# Patient Record
Sex: Male | Born: 1985 | Race: Black or African American | Hispanic: No | Marital: Single | Smoking: Current every day smoker
Health system: Southern US, Community
[De-identification: ages and names within clinical notes are randomized; demographics above are authoritative.]

## PROBLEM LIST (undated history)

## (undated) ENCOUNTER — Encounter

## (undated) ENCOUNTER — Telehealth

## (undated) ENCOUNTER — Other Ambulatory Visit

## (undated) ENCOUNTER — Ambulatory Visit

## (undated) ENCOUNTER — Inpatient Hospital Stay: Payer: Medicaid (Managed Care)

## (undated) ENCOUNTER — Encounter: Attending: Addiction (Substance Use Disorder) | Primary: Addiction (Substance Use Disorder)

## (undated) ENCOUNTER — Ambulatory Visit: Payer: Medicaid (Managed Care)

## (undated) ENCOUNTER — Encounter: Attending: Registered" | Primary: Registered"

## (undated) ENCOUNTER — Ambulatory Visit: Payer: BLUE CROSS/BLUE SHIELD

## (undated) ENCOUNTER — Ambulatory Visit: Attending: Addiction (Substance Use Disorder) | Primary: Addiction (Substance Use Disorder)

## (undated) ENCOUNTER — Telehealth: Attending: Registered" | Primary: Registered"

## (undated) ENCOUNTER — Encounter: Attending: Clinical | Primary: Clinical

## (undated) ENCOUNTER — Ambulatory Visit: Payer: PRIVATE HEALTH INSURANCE

## (undated) DIAGNOSIS — K649 Unspecified hemorrhoids: Secondary | ICD-10-CM

---

## 2010-09-05 ENCOUNTER — Emergency Department (HOSPITAL_COMMUNITY)
Admission: EM | Admit: 2010-09-05 | Discharge: 2010-09-05 | Payer: Self-pay | Source: Home / Self Care | Admitting: Emergency Medicine

## 2010-11-28 ENCOUNTER — Emergency Department (HOSPITAL_COMMUNITY)
Admission: EM | Admit: 2010-11-28 | Discharge: 2010-11-28 | Disposition: A | Discharge status: Home / Self Care | Payer: Self-pay | Attending: Emergency Medicine | Admitting: Emergency Medicine

## 2010-11-28 DIAGNOSIS — R369 Urethral discharge, unspecified: Secondary | ICD-10-CM | POA: Insufficient documentation

## 2010-11-28 DIAGNOSIS — A64 Unspecified sexually transmitted disease: Secondary | ICD-10-CM | POA: Insufficient documentation

## 2010-11-29 LAB — GC/CHLAMYDIA PROBE AMP, GENITAL: Chlamydia, DNA Probe: NEGATIVE

## 2011-08-01 ENCOUNTER — Encounter: Payer: Self-pay | Admitting: Emergency Medicine

## 2011-08-01 ENCOUNTER — Emergency Department (HOSPITAL_COMMUNITY)
Admission: EM | Admit: 2011-08-01 | Discharge: 2011-08-01 | Disposition: A | Discharge status: Home / Self Care | Payer: Self-pay | Attending: Emergency Medicine | Admitting: Emergency Medicine

## 2011-08-01 DIAGNOSIS — A630 Anogenital (venereal) warts: Secondary | ICD-10-CM | POA: Diagnosis present

## 2011-08-01 DIAGNOSIS — K644 Residual hemorrhoidal skin tags: Secondary | ICD-10-CM | POA: Insufficient documentation

## 2011-08-01 DIAGNOSIS — R300 Dysuria: Secondary | ICD-10-CM | POA: Insufficient documentation

## 2011-08-01 DIAGNOSIS — R369 Urethral discharge, unspecified: Secondary | ICD-10-CM | POA: Insufficient documentation

## 2011-08-01 DIAGNOSIS — N342 Other urethritis: Secondary | ICD-10-CM | POA: Insufficient documentation

## 2011-08-01 DIAGNOSIS — K921 Melena: Secondary | ICD-10-CM | POA: Insufficient documentation

## 2011-08-01 DIAGNOSIS — K6289 Other specified diseases of anus and rectum: Secondary | ICD-10-CM | POA: Insufficient documentation

## 2011-08-01 DIAGNOSIS — K625 Hemorrhage of anus and rectum: Secondary | ICD-10-CM | POA: Insufficient documentation

## 2011-08-01 MED ORDER — AZITHROMYCIN 250 MG PO TABS
1000.0000 mg | ORAL_TABLET | Freq: Once | ORAL | Status: AC
  Administered 2011-08-01: 1000 mg via ORAL
  Filled 2011-08-01: qty 4

## 2011-08-01 MED ORDER — TRAMADOL HCL 50 MG PO TABS: 50.0000 mg | ORAL_TABLET | Freq: Four times a day (QID) | ORAL | Status: AC | PRN

## 2011-08-01 MED ORDER — LIDOCAINE HCL (PF) 1 % IJ SOLN
INTRAMUSCULAR | Status: AC
  Administered 2011-08-01: 08:00:00
  Filled 2011-08-01: qty 5

## 2011-08-01 MED ORDER — DOCUSATE SODIUM 100 MG PO CAPS: 100.0000 mg | ORAL_CAPSULE | Freq: Two times a day (BID) | ORAL | Status: AC

## 2011-08-01 MED ORDER — HYDROCORTISONE ACETATE 25 MG RE SUPP: 25.0000 mg | Freq: Two times a day (BID) | RECTAL | Status: AC

## 2011-08-01 MED ORDER — CEFTRIAXONE SODIUM 250 MG IJ SOLR
250.0000 mg | Freq: Once | INTRAMUSCULAR | Status: AC
  Administered 2011-08-01: 250 mg via INTRAMUSCULAR
  Filled 2011-08-01: qty 250

## 2011-08-01 NOTE — ED Provider Notes (Signed)
History     CSN: [id number] Arrival date & time: 08/01/2011  4:11 AM   First MD Initiated Contact with Patient 08/01/11 [phone]      Chief Complaint  Patient presents with  . Rectal Bleeding    (Consider location/radiation/quality/duration/timing/severity/associated sxs/prior treatment) HPI Comments: Pt states he has h/o hemorrhoids however pain and bleeding has been worse than normal in past 24 hours. No h/o bleeding disorder or blood thinner. No h/o fatigue or other symptoms of anemia.  He has had same symptoms in past but bleeding has typically been less in amount.   Patient admits to having unprotected anal intercourse 5 days ago. Now c/o pain with urination and occasional mild discharge. No sores or ulcers.   Patient is a 25 y.o. male presenting with hematochezia and penile discharge. The history is provided by the patient.  Rectal Bleeding  The current episode started yesterday. The problem occurs occasionally. The problem has been unchanged. The pain is moderate. The stool is described as soft and streaked with blood. Prior successful therapies include stool softeners. Associated symptoms include hemorrhoids and rectal pain. Pertinent negatives include no anorexia, no fever, no abdominal pain, no vomiting, no hematuria and no rash.  Penile Discharge This is a new problem. The current episode started yesterday. The problem has been unchanged. Pertinent negatives include no abdominal pain, anorexia, fatigue, fever, rash, sore throat or vomiting. He has tried nothing for the symptoms.    History reviewed. No pertinent past medical history.  History reviewed. No pertinent past surgical history.  No family history on file.  History  Substance Use Topics  . Smoking status: Current Everyday Smoker  . Smokeless tobacco: Not on file  . Alcohol Use: Yes     OCCASIONAL      Review of Systems  Constitutional: Negative for fever and fatigue.  HENT: Negative for nosebleeds and sore  throat.   Eyes: Negative for discharge and redness.  Gastrointestinal: Positive for blood in stool, hematochezia, rectal pain and hemorrhoids. Negative for vomiting, abdominal pain and anorexia.  Genitourinary: Positive for dysuria and discharge. Negative for hematuria, flank pain, genital sores, penile pain and testicular pain.  Musculoskeletal: Negative for back pain.  Skin: Negative for rash.  Neurological: Negative for light-headedness.  Hematological: Negative for adenopathy.    Allergies  Review of patient's allergies indicates no known allergies.  Home Medications   Current Outpatient Rx  Name Route Sig Dispense Refill  . IBUPROFEN 200 MG PO TABS Oral Take 200 mg by mouth every 6 (six) hours as needed. For pain     . NAPROXEN SODIUM 220 MG PO TABS Oral Take 220 mg by mouth 2 (two) times daily as needed. For pain       BP 125/63  Pulse 76  Temp(Src) 98.3 F (36.8 C) (Oral)  SpO2 99%  Physical Exam  Nursing note and vitals reviewed. Constitutional: He is oriented to person, place, and time. He appears well-developed and well-nourished.  HENT:  Head: Normocephalic and atraumatic.  Mouth/Throat: Oropharynx is clear and moist.  Eyes: Conjunctivae are normal. Pupils are equal, round, and reactive to light. Right eye exhibits no discharge. Left eye exhibits no discharge.  Neck: Normal range of motion. Neck supple.  Pulmonary/Chest: Effort normal.  Abdominal: Soft. Bowel sounds are normal. There is no tenderness. There is no rebound and no guarding.  Genitourinary: Prostate normal and penis normal. Rectal exam shows external hemorrhoid.  Musculoskeletal: He exhibits no edema.  Neurological: He is alert and oriented  to person, place, and time.  Skin: Skin is warm and dry.  Psychiatric: He has a normal mood and affect.    ED Course  Procedures (including critical care time)  Labs Reviewed - No data to display No results found.   No diagnosis found.  8:02 AM Pt seen  and examined.  Will test and treat for STD exposure.  Patient counseled on safe sexual practices.  Told them that they should not have sexual contact for next 7 days and that they need to inform sexual partners so that they can get tested and treated as well.  Urged f/u [facility] for HIV and syphillis testing.  Patient verbalizes understanding and agrees with plan.   Pt counseled on diet and measures to reduce rectal pain and bleeding. Urged return or f/u with worsening symptoms or if bleeding persists > 3 days.    MDM  Possible STD exposure, treated. No concern for syphilis however pt should be tested. Ext hemorrhoid on exam, no fissue but could have one inside rectum given pain on exam. No stool in rectal vault to test. Doubt GI bleed given history of hemorrhoids and mild bleeding.        Carolee Rota, [place] 08/01/11 [phone]

## 2011-08-01 NOTE — ED Notes (Signed)
PT. REPORTS RECTAL BLEEDING  ONSET LAST WEEK , WORSE THIS EVENING WITH PAIN , DENIES INJURY ,  ALSO REPORTS DYSURIA / PENILE PAIN AND DISCHARGE .

## 2011-08-01 NOTE — ED Notes (Signed)
Patient c/o rectal pain and bleeding onset several days ago, states he knows he has hemorroids, and he normally has a small amt of bleeding however last pm when he went to have a bowel movement it was nothing but bright red blood. Also c/o penile discharge.

## 2011-08-02 NOTE — ED Provider Notes (Signed)
Medical screening examination/treatment/procedure(s) were performed by non-physician practitioner and as supervising physician I was immediately available for consultation/collaboration.  Raeford Razor, MD 08/02/11 [phone]

## 2012-02-16 ENCOUNTER — Emergency Department (HOSPITAL_COMMUNITY): Payer: Self-pay

## 2012-02-16 ENCOUNTER — Emergency Department (HOSPITAL_COMMUNITY)
Admission: EM | Admit: 2012-02-16 | Discharge: 2012-02-16 | Disposition: A | Discharge status: Home / Self Care | Payer: Self-pay | Attending: Emergency Medicine | Admitting: Emergency Medicine

## 2012-02-16 ENCOUNTER — Encounter (HOSPITAL_COMMUNITY): Payer: Self-pay

## 2012-02-16 DIAGNOSIS — K6289 Other specified diseases of anus and rectum: Secondary | ICD-10-CM | POA: Insufficient documentation

## 2012-02-16 DIAGNOSIS — F17200 Nicotine dependence, unspecified, uncomplicated: Secondary | ICD-10-CM | POA: Insufficient documentation

## 2012-02-16 DIAGNOSIS — R109 Unspecified abdominal pain: Secondary | ICD-10-CM | POA: Insufficient documentation

## 2012-02-16 DIAGNOSIS — K625 Hemorrhage of anus and rectum: Secondary | ICD-10-CM | POA: Insufficient documentation

## 2012-02-16 DIAGNOSIS — R112 Nausea with vomiting, unspecified: Secondary | ICD-10-CM | POA: Insufficient documentation

## 2012-02-16 DIAGNOSIS — K644 Residual hemorrhoidal skin tags: Secondary | ICD-10-CM | POA: Insufficient documentation

## 2012-02-16 HISTORY — DX: Unspecified hemorrhoids: K64.9

## 2012-02-16 LAB — URINE MICROSCOPIC-ADD ON

## 2012-02-16 LAB — CBC
HCT: 39.4 % (ref 39.0–52.0)
Hemoglobin: 13.3 g/dL (ref 13.0–17.0)
MCH: 29.9 pg (ref 26.0–34.0)
MCHC: 33.8 g/dL (ref 30.0–36.0)
Platelets: 153 10*3/uL (ref 150–400)
RBC: 4.45 MIL/uL (ref 4.22–5.81)

## 2012-02-16 LAB — DIFFERENTIAL
Lymphocytes Relative: 29 % (ref 12–46)
Neutro Abs: 3.7 10*3/uL (ref 1.7–7.7)
Neutrophils Relative %: 63 % (ref 43–77)

## 2012-02-16 LAB — BASIC METABOLIC PANEL
BUN: 9 mg/dL (ref 6–23)
Calcium: 9.8 mg/dL (ref 8.4–10.5)
Chloride: 102 mEq/L (ref 96–112)
Creatinine, Ser: 0.86 mg/dL (ref 0.50–1.35)
GFR calc Af Amer: >90 mL/min (ref >90)
GFR calc non Af Amer: >90 mL/min (ref >90)
Glucose, Bld: 96 mg/dL (ref 70–99)
Potassium: 3.6 mEq/L (ref 3.5–5.1)

## 2012-02-16 LAB — URINALYSIS, ROUTINE W REFLEX MICROSCOPIC: Hgb urine dipstick: NEGATIVE

## 2012-02-16 MED ORDER — IOHEXOL 300 MG/ML  SOLN
100.0000 mL | Freq: Once | INTRAMUSCULAR | Status: AC | PRN
  Administered 2012-02-16: 100 mL via INTRAVENOUS

## 2012-02-16 MED ORDER — HYDROCODONE-ACETAMINOPHEN 5-325 MG PO TABS: ORAL_TABLET | ORAL | Status: AC

## 2012-02-16 MED ORDER — ONDANSETRON HCL 4 MG/2ML IJ SOLN
4.0000 mg | Freq: Once | INTRAMUSCULAR | Status: AC
  Administered 2012-02-16: 4 mg via INTRAVENOUS
  Filled 2012-02-16: qty 2

## 2012-02-16 MED ORDER — HYDROMORPHONE HCL PF 1 MG/ML IJ SOLN
1.0000 mg | Freq: Once | INTRAMUSCULAR | Status: AC
  Administered 2012-02-16: 1 mg via INTRAVENOUS
  Filled 2012-02-16: qty 1

## 2012-02-16 MED ORDER — POLYETHYLENE GLYCOL 3350 17 GM/SCOOP PO POWD: 17.0000 g | Freq: Every day | ORAL | Status: AC

## 2012-02-16 MED ORDER — HYDROCORTISONE ACETATE 25 MG RE SUPP: 25.0000 mg | Freq: Two times a day (BID) | RECTAL | Status: AC

## 2012-02-16 NOTE — Discharge Instructions (Signed)
Please read and follow all provided instructions.  Your diagnoses today include:  1. Rectal pain     Tests performed today include:  Blood tests that are normal  CT of your abdomen that did not show any infections around your rectum  Vital signs. See below for your results today.   Medications prescribed:   Vicodin (hydrocodone/acetaminophen) - narcotic pain medication  You have been prescribed narcotic pain medication such as Vicodin or Percocet: DO NOT drive or perform any activities that require you to be awake and alert because this medicine can make you drowsy. BE VERY CAREFUL not to take multiple medicines containing Tylenol (also called acetaminophen). Doing so can lead to an overdose which can damage your liver and cause liver failure and possibly death.    Miralax - laxative   Anusol suppository - anti-inflammatory  Take any prescribed medications only as directed.  Home care instructions:  Follow any educational materials contained in this packet.  BE VERY CAREFUL not to take multiple medicines containing Tylenol (also called acetaminophen). Doing so can lead to an overdose which can damage your liver and cause liver failure and possibly death.   Continue high fiber diet. Continue soaks several times a day in bathtub with warm water.   Use pain medication only when needed. This medication can caused you to be more constipated.   Follow-up instructions: Follow-up with the surgeon listed for further evaluation.   Please follow-up with your primary care provider in the next 3 days for further evaluation of your symptoms. If you do not have a primary care doctor -- see below for referral information.   Return instructions:   Please return to the Emergency Department if you experience worsening symptoms.   Return with worsening abdominal pain, fever  Please return if you have any other emergent concerns.  Additional Information:  Your vital signs today  were: BP 140/81  Pulse 92  Temp(Src) 97.7 F (36.5 C) (Oral)  Resp 21  SpO2 100% If your blood pressure (BP) was elevated above 135/85 this visit, please have this repeated by your doctor within one month. -------------- No Primary Care Doctor Call [facility]  [phone] Other agencies that provide inexpensive medical care    [facility]  [phone]    [facility]  [phone]    [facility]  [phone]    [facility]  [phone]    [facility]  [phone]    [facility]  [phone] -------------- RESOURCE GUIDE:  Dental Problems  Patients with Medicaid: [facility] [phone] [address]                                            [phone] [address] Phone:  [phone]                                                   Phone:  [phone]  If unable to pay or uninsured, contact:  [facility] or [facility]. to become qualified for the adult dental clinic.  Chronic Pain Problems Contact [facility]  [facility]  [phone] Patients need to be referred by their primary care doctor.  Insufficient Money for Medicine Contact [employer]:  call "211" or [facility] 6510638755.  Psychological Services [facility]  [phone] [facility]  [phone] [facility]   [phone] (emergency services [phone])  Substance Abuse Resources [facility]  [phone] [facility] [phone] [facility] [phone] Floydene Flock [phone] Residential & Outpatient Substance Abuse Program  [phone]  Abuse/Neglect [facility] Child Abuse Hotline 502-103-6637 [facility] Child Abuse Hotline [phone] (After Hours)  Emergency Shelter [facility] [phone]  M[facility] Homes [facility] [place] [facility] [phone] [facility] [phone]  [facility] Resources  [facility]     [employer]                          [facility] Dept. [address] [place]                       [address]      [address]  Blondell Reveal Phone:  [phone]                                   Phone:  [phone]                 Phone:  [phone]  [facility] Mental [facility] Phone:  [phone]  [facility] Child Abuse Hotline (713)[phone] (After Hours)

## 2012-02-16 NOTE — ED Notes (Signed)
Patient reports that he has a history of hemorrhoids and rectal bleeding started 10 days ago. Patient states pain and amount of bleeding has gotten progressively worse. Patient states blood was on the stool and in the stool and bright red in color. Patient denies having clots. Patient states he has been taking preparation H, liquor, hot baths, Percocet and anything he can get his hands on.

## 2012-02-16 NOTE — ED Provider Notes (Signed)
Medical screening examination/treatment/procedure(s) were performed by non-physician practitioner and as supervising physician I was immediately available for consultation/collaboration.   Celene Kras, MD 02/16/12 [phone]

## 2012-02-16 NOTE — ED Provider Notes (Signed)
History     CSN: [id number]  Arrival date & time 02/16/12  1052   First MD Initiated Contact with Patient 02/16/12 1104      Chief Complaint  Patient presents with  . Rectal Bleeding    (Consider location/radiation/quality/duration/timing/severity/associated sxs/prior treatment) HPI Comments: Patient presents with complaint of worsening rectal pain over the past one to 2 weeks. Patient has had this in the past and states he's had external hemorrhoids. Patient reports doing sitz baths, eating fiber, taking a friend's Percocet, using preparation H but this has not helped. He has had several small or bowel movements with much pain. Patient has noted bright red blood in his bowel movements. Patient states he is having trouble with urination, not noted blood. He states it hurts to pass gas. Patient has history of receptive anal sex. He denies other chronic medical problems. He denies draining from his anus or penis. Patient states she has had episodes of nausea and vomiting but is unsure of fever.   Patient is a 26 y.o. male presenting with hematochezia. The history is provided by the patient.  Rectal Bleeding  The current episode started 5 to 7 days ago. The onset was gradual. The problem has been unchanged. The pain is severe. The stool is described as hard. Associated symptoms include abdominal pain, hemorrhoids, nausea, rectal pain and vomiting. Pertinent negatives include no fever, no diarrhea, no chest pain, no headaches, no coughing and no rash.    Past Medical History  Diagnosis Date  . Hemorrhoids     History reviewed. No pertinent past surgical history.  No family history on file.  History  Substance Use Topics  . Smoking status: Current Everyday Smoker  . Smokeless tobacco: Never Used  . Alcohol Use: Yes     OCCASIONAL      Review of Systems  Constitutional: Negative for fever.  HENT: Negative for sore throat and rhinorrhea.   Eyes: Negative for redness.    Respiratory: Negative for cough.   Cardiovascular: Negative for chest pain.  Gastrointestinal: Positive for nausea, vomiting, abdominal pain, constipation, blood in stool, hematochezia, rectal pain and hemorrhoids. Negative for diarrhea and abdominal distention.  Genitourinary: Negative for dysuria, discharge, scrotal swelling, penile pain and testicular pain.  Musculoskeletal: Negative for myalgias.  Skin: Negative for rash.  Neurological: Negative for headaches.    Allergies  Review of patient's allergies indicates no known allergies.  Home Medications   Current Outpatient Rx  Name Route Sig Dispense Refill  . ASPIRIN 325 MG PO TABS Oral Take 650 mg by mouth daily as needed. For pain.    Marland Kitchen DIPHENHYDRAMINE HCL 25 MG PO TABS Oral Take 25 mg by mouth every 6 (six) hours as needed. For sleep.    Marland Kitchen HYDROCODONE-ACETAMINOPHEN 5-500 MG PO TABS Oral Take 1 tablet by mouth every 6 (six) hours as needed. For pain.    Marland Kitchen NAPROXEN SODIUM 220 MG PO TABS Oral Take 440 mg by mouth 2 (two) times daily as needed. For pain      BP 140/81  Pulse 92  Temp(Src) 97.7 F (36.5 C) (Oral)  Resp 21  SpO2 100%  Physical Exam  Nursing note and vitals reviewed. Constitutional: He appears well-developed and well-nourished.  HENT:  Head: Normocephalic and atraumatic.  Eyes: Conjunctivae are normal. Right eye exhibits no discharge. Left eye exhibits no discharge.  Neck: Normal range of motion. Neck supple.  Cardiovascular: Normal rate, regular rhythm and normal heart sounds.   Pulmonary/Chest: Effort normal and breath  sounds normal.  Abdominal: Soft. There is no tenderness.  Genitourinary: Penis normal. Rectal exam shows external hemorrhoid (several very small, non-thrombosed external hemorrhoids) and tenderness. Rectal exam shows no internal hemorrhoid and no fissure. No penile tenderness.       Only able to insert fingertip into anus before patient has severe pain and cannot tolerate. Patient's perineum  is firm and tender as well. Skin appears normal.   Neurological: He is alert.  Skin: Skin is warm and dry.  Psychiatric: He has a normal mood and affect.    ED Course  Procedures (including critical care time)  Labs Reviewed  URINALYSIS, ROUTINE W REFLEX MICROSCOPIC - Abnormal; Notable for the following:    Leukocytes, UA SMALL (*)    All other components within normal limits  URINE MICROSCOPIC-ADD ON - Abnormal; Notable for the following:    Squamous Epithelial / LPF FEW (*)    Bacteria, UA FEW (*)    All other components within normal limits  CBC  DIFFERENTIAL  BASIC METABOLIC PANEL   Ct Pelvis W Contrast  02/16/2012  *RADIOLOGY REPORT*  Clinical Data:  Rectal pain and bleeding  CT PELVIS WITH CONTRAST  Technique:  Multidetector CT imaging of the pelvis was performed using the standard protocol following bolus administration of intravenous contrast.  Contrast: OMNIPAQUE IOHEXOL 300 MG/ML  SOLN  Comparison:   None.  Findings:  No evidence of rectal mass or perirectal abscess. Unremarkable bladder and prostate.  No free fluid.  No disproportionate dilatation of bowel.  IMPRESSION: No acute intrapelvic pathology.  Original Report Authenticated By: Donavan Burnet, M.D.     1. Rectal pain     11:29 AM Patient seen and examined. Work-up initiated. Medications ordered. CT ordered to r/o rectal abscess given pain out of proportion and risk factors.   Vital signs reviewed and are as follows: Filed Vitals:   02/16/12 1104  BP: 140/81  Pulse: 92  Temp: 97.7 F (36.5 C)  Resp: 21   4:50 PM Labs reassuring. CT is negative for cellulitis or abscess. Patient's pain is improved. Will treat for constipation and hemorrhoids. Will have patient follow-up with surgery for further eval.   Patient counseled on use of narcotic pain medications. Counseled not to combine these medications with others containing tylenol. Urged not to drink alcohol, drive, or perform any other activities that  requires focus while taking these medications. Urged to only use for severe pain because this medicine will make him more constipated. The patient verbalizes understanding and agrees with the plan.  MDM  Rectal pain -- given amount of tenderness and inability to fully examine patient, CT was performed and is negative. He will need surgery eval. No acute or emergent conditions identified. Patient will be given symptomatic control until surgery eval.          Renne Crigler, PA 02/16/12 1652

## 2012-02-23 ENCOUNTER — Ambulatory Visit (INDEPENDENT_AMBULATORY_CARE_PROVIDER_SITE_OTHER): Payer: Self-pay | Admitting: Surgery

## 2012-03-18 ENCOUNTER — Ambulatory Visit (INDEPENDENT_AMBULATORY_CARE_PROVIDER_SITE_OTHER): Payer: Self-pay | Admitting: Surgery

## 2012-03-26 ENCOUNTER — Encounter (INDEPENDENT_AMBULATORY_CARE_PROVIDER_SITE_OTHER): Payer: Self-pay | Admitting: Surgery

## 2012-05-07 ENCOUNTER — Encounter (INDEPENDENT_AMBULATORY_CARE_PROVIDER_SITE_OTHER): Payer: Self-pay | Admitting: Surgery

## 2012-05-07 ENCOUNTER — Ambulatory Visit (INDEPENDENT_AMBULATORY_CARE_PROVIDER_SITE_OTHER): Payer: Self-pay | Admitting: Surgery

## 2012-05-07 ENCOUNTER — Telehealth (INDEPENDENT_AMBULATORY_CARE_PROVIDER_SITE_OTHER): Payer: Self-pay

## 2012-05-07 VITALS — BP 142/80 | HR 68 | Temp 97.1°F | Resp 12 | Ht 69.5 in | Wt 144.6 lb

## 2012-05-07 DIAGNOSIS — A630 Anogenital (venereal) warts: Secondary | ICD-10-CM

## 2012-05-07 MED ORDER — PRAMOXINE HCL 1 % RE FOAM: RECTAL | Status: AC | PRN

## 2012-05-07 NOTE — Telephone Encounter (Signed)
Patient extremely anxious.  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  His Aunt, however, keeps trying to get [place] to tell her what's going on.  We noted this would be a HIPPA violation and the only person that has the right to that information as the patient since he has expressly forbidden [place] to discuss it with her or anyone else.  That is our legal obligation to follow the patient's wishes on this.  Would recommend the patient talk to his family.  We noted our plan and recommendations.

## 2012-05-07 NOTE — Telephone Encounter (Signed)
Pt's aunt came with pt today for his appt with Dr Michaell Cowing. The aunt asked to speak to me in the lobby with the pt and his family after he had seen Dr Michaell Cowing today. The pt during his visit told me that he did not want his family knowing his diagnosis b/c he thought his aunt would get really mad. The front desk asked for me to go speak to them with the pt and when I went out there the aunt was requesting the after visit summary sheet from today's visit. I explained to her that she would have to get that info from the pt b/c HIPPA and she was wanting [place] to prescribe some medicine for the pt. The aunt kept trying to explain to me that we needed to order some medicine to help prevent the spasms with hemorrhoids and the discomfort. I advised her that we would not prescribe any pain medicines but she advised me that this was not pain medicine. I advised her that Dr Michaell Cowing was only going to prescribe some Anal pram for the pt nothing else but they informed me the pt has no money. I then advised her that anal pram without insurance is expensive and the pt needed to stick with otc preparation H per Dr Michaell Cowing. I then advised the aunt that the pt just really needs to have the surgery to fix the problem but once again she told me the pt has no money so it will be a while before that gets scheduled. I told her she needed to talk to the pt get all of the info from today's visit. I did notify Dr Michaell Cowing of the conversation.

## 2012-05-07 NOTE — Patient Instructions (Addendum)
See the Handout(s) we gave you.  Consider surgery.  Please call our office at [phone] if you wish to schedule surgery or if you have further questions / concerns.   Genital Warts Genital warts are a sexually transmitted infection. They may appear as small bumps on the tissues of the genital area. CAUSES  Genital warts are caused by a virus called human papillomavirus (HPV). HPV is the most common sexually transmitted disease (STD) and infection of the sex organs. This infection is spread by having unprotected sex with an infected person. It can be spread by vaginal, anal, and oral sex. Many people do not know they are infected. They may be infected for years without problems. However, even if they do not have problems, they can unknowingly pass the infection to their sexual partners. SYMPTOMS   Itching and irritation in the genital area.   Warts that bleed.   Painful sexual intercourse.  DIAGNOSIS  Warts are usually recognized with the naked eye on the vagina, vulva, perineum, anus, and rectum. Certain tests can also diagnose genital warts, such as:  A Pap test.   A tissue sample (biopsy) exam.   Colposcopy. A magnifying tool is used to examine the vagina and cervix. The HPV cells will change color when certain solutions are used.  TREATMENT  Warts can be removed by:  Applying certain chemicals, such as cantharidin or podophyllin.   Liquid nitrogen freezing (cryotherapy).   Immunotherapy with candida or trichophyton injections.   Laser treatment.   Burning with an electrified probe (electrocautery).   Interferon injections.   Surgery.  PREVENTION  HPV vaccination can help prevent HPV infections that cause genital warts and that cause cancer of the cervix. It is recommended that the vaccination be given to people between the ages 9 to 26 years old. The vaccine might not work as well or might not work at all if you already have HPV. It should not be given to pregnant  women. HOME CARE INSTRUCTIONS   It is important to follow your caregiver's instructions. The warts will not go away without treatment. Repeat treatments are often needed to get rid of warts. Even after it appears that the warts are gone, the normal tissue underneath often remains infected.   Do not try to treat genital warts with medicine used to treat hand warts. This type of medicine is strong and can burn the skin in the genital area, causing more damage.   Tell your past and current sexual partner(s) that you have genital warts. They may be infected also and need treatment.   Avoid sexual contact while being treated.   Do not touch or scratch the warts. The infection may spread to other parts of your body.   Women with genital warts should have a cervical cancer check (Pap test) at least once a year. This type of cancer is slow-growing and can be cured if found early. Chances of developing cervical cancer are increased with HPV.   Inform your obstetrician about your warts in the event of pregnancy. This virus can be passed to the baby's respiratory tract. Discuss this with your caregiver.   Use a condom during sexual intercourse. Following treatment, the use of condoms will help prevent reinfection.   Ask your caregiver about using over-the-counter anti-itch creams.  SEEK MEDICAL CARE IF:   Your treated skin becomes red, swollen, or painful.   You have a fever.   You feel generally ill.   You feel little lumps in   and around your genital area.   You are bleeding or have painful sexual intercourse.  MAKE SURE YOU:   Understand these instructions.   Will watch your condition.   Will get help right away if you are not doing well or get worse.  Document Released: 09/01/2000 Document Revised: 08/24/2011 Document Reviewed: 03/13/2011 [employer] Patient Information 2012 [employer]. 

## 2012-05-07 NOTE — Addendum Note (Signed)
Addended by: Ardeth Sportsman on: 05/07/2012 04:16 PM   Modules accepted: Orders

## 2012-05-07 NOTE — Progress Notes (Signed)
Subjective:     Patient ID: Juan Mercado, male   DOB: 1986/03/11, 26 y.o.   MRN: [id number]  HPI  Juan Mercado  [birth year] [id number]  Patient Care Team: Provider Not In System as PCP - General  This patient is a 25 y.o.male who presents today for surgical evaluation at the request of Felicita Gage PA-C & [facility].   Reason for evaluation: Persistent painful hemorrhoids  Pleasant young male who has struggled with hemorrhoids he says since 2005.  Swelling and bleeding.  Usually mild.  He noticed worsening pain in November 2012.  He went to the emergency room.  There was concern of possible thrombosed hemorrhoid.  It has gotten worse.  He returned emergency room in late May.  Again hemorrhoids.  He was sent to me for surgical vibration.  Patient has had unprotected anal sex.  Struggles with constipation and usually has small bowel movements About every other day.  Difficult to have a good bowel movement.  He describes the pain as rather sharp and intense.  Occasionally burning.  Worse with bowel movements.  He notes flecks of blood in the toilet and when he wipes.  Tried Preparation H but gets burning and irritation with it.  He is hoping something can be done.  No prior history of any anorectal treatments or colonoscopy.  He's been tested recently and is HIV negative.  Patient Active Problem List  Diagnosis  . Hemorrhoids    Past Medical History  Diagnosis Date  . Hemorrhoids     History reviewed. No pertinent past surgical history.  History   Social History  . Marital Status: Single    Spouse Name: N/A    Number of Children: N/A  . Years of Education: N/A   Occupational History  . Not on file.   Social History Main Topics  . Smoking status: Current Everyday Smoker -- 0.5 packs/day    Types: Cigarettes  . Smokeless tobacco: Never Used  . Alcohol Use: Yes     OCCASIONAL  . Drug Use: No  . Sexually Active: No   Other Topics Concern  . Not on file    Social History Narrative  . No narrative on file    History reviewed. No pertinent family history.  Current Outpatient Prescriptions  Medication Sig Dispense Refill  . aspirin 325 MG tablet Take 650 mg by mouth daily as needed. For pain.      . diphenhydrAMINE (BENADRYL) 25 MG tablet Take 25 mg by mouth every 6 (six) hours as needed. For sleep.      Marland Kitchen HYDROcodone-acetaminophen (VICODIN) 5-500 MG per tablet Take 1 tablet by mouth every 6 (six) hours as needed. For pain.      . naproxen sodium (ANAPROX) 220 MG tablet Take 440 mg by mouth 2 (two) times daily as needed. For pain         No Known Allergies  BP 142/80  Pulse 68  Temp 97.1 F (36.2 C) (Temporal)  Resp 12  Ht 5' 9.5" (1.765 m)  Wt 144 lb 9.6 oz (65.59 kg)  BMI 21.05 kg/m2  No results found.   Review of Systems  Constitutional: Negative for fever, chills and diaphoresis.  HENT: Negative for nosebleeds, sore throat, facial swelling, mouth sores, trouble swallowing and ear discharge.   Eyes: Negative for photophobia, discharge and visual disturbance.  Respiratory: Negative for choking, chest tightness, shortness of breath and stridor.   Cardiovascular: Negative for chest pain and palpitations.  Gastrointestinal:  Positive for constipation, anal bleeding and rectal pain. Negative for nausea, vomiting, abdominal pain, diarrhea, blood in stool and abdominal distention.  Genitourinary: Negative for dysuria, urgency, difficulty urinating and testicular pain.  Musculoskeletal: Negative for myalgias, back pain, arthralgias and gait problem.  Skin: Negative for color change, pallor, rash and wound.  Neurological: Negative for dizziness, speech difficulty, weakness, numbness and headaches.  Hematological: Negative for adenopathy. Does not bruise/bleed easily.  Psychiatric/Behavioral: Negative for hallucinations, confusion and agitation.       Objective:   Physical Exam  Constitutional: He is oriented to person, place,  and time. He appears well-developed and well-nourished. No distress.  HENT:  Head: Normocephalic.  Mouth/Throat: Oropharynx is clear and moist. No oropharyngeal exudate.  Eyes: Conjunctivae and EOM are normal. Pupils are equal, round, and reactive to light. No scleral icterus.  Neck: Normal range of motion. Neck supple. No tracheal deviation present.  Cardiovascular: Normal rate, regular rhythm and intact distal pulses.   Pulmonary/Chest: Effort normal and breath sounds normal. No respiratory distress.  Abdominal: Soft. He exhibits no distension and no mass. There is no tenderness. There is no guarding. Hernia confirmed negative in the right inguinal area and confirmed negative in the left inguinal area.  Genitourinary:       Exam done with assistance of male Medical Assistant in the room.  Perianal skin clean with good hygiene.  No pruritis.   No pilonidal disease.  No abscess/fistula.   High sphincter tone.    Numerous 3-51mm condylomata perianal 2cm out & into anal canal.  Very tender.  No definite posterior midline anal fissure.  Too sensitive to tolerate DRE  Musculoskeletal: Normal range of motion. He exhibits no tenderness.  Lymphadenopathy:    He has no cervical adenopathy.       Right: No inguinal adenopathy present.       Left: No inguinal adenopathy present.  Neurological: He is alert and oriented to person, place, and time. No cranial nerve deficit. He exhibits normal muscle tone. Coordination normal.  Skin: Skin is warm and dry. No rash noted. He is not diaphoretic. No erythema. No pallor.  Psychiatric: He has a normal mood and affect. His behavior is normal. Judgment and thought content normal.       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  No definite proof of fissure or hemorrhoids    Plan:     He is quite miserable with these.  I recommended examination under anesthesia with removal of the  anal warts.  Plan to do CO2 laser to minimize pain and discomfort.  Make sure there nor other problems such as hemorrhoids or a missed fissure.  I did discuss procedure with him:  The anatomy & physiology of the anorectal region was discussed.  The pathophysiology of anorectal warts and differential diagnosis was discussed.  Natural history risks without surgery was discussed such as further growth and cancer.   I stressed the importance of office follow-up to catch early recurrence & minimize/halt progression of disease.  Interventions such as cauterization by topical agents were discussed.  The patient's symptoms are not adequately controlled by non-operative treatments.  I feel the risks & problems of no surgery outweigh the operative risks; therefore, I recommended surgery to treat the anal warts by removal, ablation and/or cauterization.  Risks such as bleeding, infection, need for further treatment, heart attack, death, and other risks were discussed.  I noted a good likelihood this will help address the problem. Goals of post-operative recovery were discussed as well.  Possibility that this will not correct all symptoms was explained.  Post-operative pain, bleeding, constipation, and other problems after surgery were discussed.  We will work to minimize complications.   Educational handouts further explaining the pathology, treatment options, and bowel regimen were given as well.  Questions were answered.  The patient expresses understanding & wishes to proceed with surgery.  He was very emotionally upset and quite shocked with the diagnosis.  After reexplaining a few times he seemed to calm down.  We'll work to coordinate this a convenient time.  He wants to get these taken care of so he can move on

## 2012-08-06 ENCOUNTER — Telehealth (INDEPENDENT_AMBULATORY_CARE_PROVIDER_SITE_OTHER): Payer: Self-pay | Admitting: General Surgery

## 2012-08-06 NOTE — Telephone Encounter (Signed)
Patient called in asking if medication can be issued for the pain he has been having. Patient was last seen by Dr. Michaell Cowing in August 2013. Advised him that due to how long it has been since he was last seen medication would not be called in, he would have to be evaluated first. Patient confirmed his condyloma has become worse since August and he is ready for surgery. I advised him that his request would be forwarded and that he may need to be seen first in order to determine the extent of the surgery to resolve the problem. Patient advised to try advil or aleve and warm water baths to help with blood flow and circulation. I also advise him to try baby wipes when he uses the bathroom as well. Patient agreed.

## 2012-08-06 NOTE — Telephone Encounter (Signed)
Schedule surgery.  No need to revisit unless having purulent drainage  Offer Rx of Analpram or 1% Proctofoam

## 2012-08-07 ENCOUNTER — Other Ambulatory Visit (INDEPENDENT_AMBULATORY_CARE_PROVIDER_SITE_OTHER): Payer: Self-pay | Admitting: Surgery

## 2012-08-07 ENCOUNTER — Telehealth (INDEPENDENT_AMBULATORY_CARE_PROVIDER_SITE_OTHER): Payer: Self-pay

## 2012-08-07 NOTE — Telephone Encounter (Signed)
Called patient and told him orders were given to schedulers and they should be calling in next day or so to schedule surgery. Asked if he still had bowel prep and he wasn't sure so I put 2 day prep in mail and mailed to pt. I told him to call our office if he did not receive it by the end of the week.

## 2012-08-19 ENCOUNTER — Ambulatory Visit (INDEPENDENT_AMBULATORY_CARE_PROVIDER_SITE_OTHER): Payer: Self-pay | Admitting: Surgery

## 2012-08-19 ENCOUNTER — Encounter (INDEPENDENT_AMBULATORY_CARE_PROVIDER_SITE_OTHER): Payer: Self-pay | Admitting: Surgery

## 2012-08-19 VITALS — BP 120/78 | HR 60 | Resp 16 | Ht 69.0 in | Wt 144.0 lb

## 2012-08-19 DIAGNOSIS — K5909 Other constipation: Secondary | ICD-10-CM | POA: Insufficient documentation

## 2012-08-19 DIAGNOSIS — K5900 Constipation, unspecified: Secondary | ICD-10-CM

## 2012-08-19 DIAGNOSIS — A630 Anogenital (venereal) warts: Secondary | ICD-10-CM

## 2012-08-19 NOTE — Patient Instructions (Addendum)
See the Handout(s) we gave you.  Consider surgery.  Please call our office at [phone] if you wish to schedule surgery or if you have further questions / concerns.    Genital Warts Genital warts are a sexually transmitted infection. They may appear as small bumps on the tissues of the genital area. CAUSES  Genital warts are caused by a virus called human papillomavirus (HPV). HPV is the most common sexually transmitted disease (STD) and infection of the sex organs. This infection is spread by having unprotected sex with an infected person. It can be spread by vaginal, anal, and oral sex. Many people do not know they are infected. They may be infected for years without problems. However, even if they do not have problems, they can unknowingly pass the infection to their sexual partners. SYMPTOMS   Itching and irritation in the genital area.  Warts that bleed.  Painful sexual intercourse. DIAGNOSIS  Warts are usually recognized with the naked eye on the vagina, vulva, perineum, anus, and rectum. Certain tests can also diagnose genital warts, such as:  A Pap test.  A tissue sample (biopsy) exam.  Colposcopy. A magnifying tool is used to examine the vagina and cervix. The HPV cells will change color when certain solutions are used. TREATMENT  Warts can be removed by:  Applying certain chemicals, such as cantharidin or podophyllin.  Liquid nitrogen freezing (cryotherapy).  Immunotherapy with candida or trichophyton injections.  Laser treatment.  Burning with an electrified probe (electrocautery).  Interferon injections.  Surgery. PREVENTION  HPV vaccination can help prevent HPV infections that cause genital warts and that cause cancer of the cervix. It is recommended that the vaccination be given to people between the ages 48 to 52 years old. The vaccine might not work as well or might not work at all if you already have HPV. It should not be given to pregnant women. HOME  CARE INSTRUCTIONS   It is important to follow your caregiver's instructions. The warts will not go away without treatment. Repeat treatments are often needed to get rid of warts. Even after it appears that the warts are gone, the normal tissue underneath often remains infected.  Do not try to treat genital warts with medicine used to treat hand warts. This type of medicine is strong and can burn the skin in the genital area, causing more damage.  Tell your past and current sexual partner(s) that you have genital warts. They may be infected also and need treatment.  Avoid sexual contact while being treated.  Do not touch or scratch the warts. The infection may spread to other parts of your body.  Women with genital warts should have a cervical cancer check (Pap test) at least once a year. This type of cancer is slow-growing and can be cured if found early. Chances of developing cervical cancer are increased with HPV.  Inform your obstetrician about your warts in the event of pregnancy. This virus can be passed to the baby's respiratory tract. Discuss this with your caregiver.  Use a condom during sexual intercourse. Following treatment, the use of condoms will help prevent reinfection.  Ask your caregiver about using over-the-counter anti-itch creams. SEEK MEDICAL CARE IF:   Your treated skin becomes red, swollen, or painful.  You have a fever.  You feel generally ill.  You feel little lumps in and around your genital area.  You are bleeding or have painful sexual intercourse. MAKE SURE YOU:   Understand these instructions.  Will  watch your condition.  Will get help right away if you are not doing well or get worse. Document Released: 09/01/2000 Document Revised: 11/27/2011 Document Reviewed: 03/13/2011 [facility] Patient Information 2013 [place], [place].  GETTING TO GOOD BOWEL HEALTH. Irregular bowel habits such as constipation and diarrhea can lead to many problems over time.   Having one soft bowel movement a day is the most important way to prevent further problems.  The anorectal canal is designed to handle stretching and feces to safely manage our ability to get rid of solid waste (feces, poop, stool) out of our body.  BUT, hard constipated stools can act like ripping concrete bricks and diarrhea can be a burning fire to this very sensitive area of our body, causing inflamed hemorrhoids, anal fissures, increasing risk is perirectal abscesses, abdominal pain/bloating, an making irritable bowel worse.     The goal: ONE SOFT BOWEL MOVEMENT A DAY!  To have soft, regular bowel movements:    Drink at least 8 tall glasses of water a day.     Take plenty of fiber.  Fiber is the undigested part of plant food that passes into the colon, acting s "natures broom" to encourage bowel motility and movement.  Fiber can absorb and hold large amounts of water. This results in a larger, bulkier stool, which is soft and easier to pass. Work gradually over several weeks up to 6 servings a day of fiber (25g a day even more if needed) in the form of: o Vegetables -- Root (potatoes, carrots, turnips), leafy green (lettuce, salad greens, celery, spinach), or cooked high residue (cabbage, broccoli, etc) o Fruit -- Fresh (unpeeled skin & pulp), Dried (prunes, apricots, cherries, etc ),  or stewed ( applesauce)  o Whole grain breads, pasta, etc (whole wheat)  o Bran cereals    Bulking Agents -- This type of water-retaining fiber generally is easily obtained each day by one of the following:  o Psyllium bran -- The psyllium plant is remarkable because its ground seeds can retain so much water. This product is available as Metamucil, Konsyl, Effersyllium, Per Diem Fiber, or the less expensive generic preparation in drug and health food stores. Although labeled a laxative, it really is not a laxative.  o Methylcellulose -- This is another fiber derived from wood which also retains water. It is available as  Citrucel. o Polyethylene Glycol - and "artificial" fiber commonly called Miralax or Glycolax.  It is helpful for people with gassy or bloated feelings with regular fiber o Flax Seed - a less gassy fiber than psyllium   No reading or other relaxing activity while on the toilet. If bowel movements take longer than 5 minutes, you are too constipated   AVOID CONSTIPATION.  High fiber and water intake usually takes care of this.  Sometimes a laxative is needed to stimulate more frequent bowel movements, but    Laxatives are not a good long-term solution as it can wear the colon out. o Osmotics (Milk of Magnesia, Fleets phosphosoda, Magnesium citrate, MiraLax, GoLytely) are safer than  o Stimulants (Senokot, Castor Oil, Dulcolax, Ex Lax)    o Do not take laxatives for more than 7days in a row.    IF SEVERELY CONSTIPATED, try a Bowel Retraining Program: o Do not use laxatives.  o Eat a diet high in roughage, such as bran cereals and leafy vegetables.  o Drink six (6) ounces of prune or apricot juice each morning.  o Eat two (2) large servings of stewed  fruit each day.  o Take one (1) heaping tablespoon of a psyllium-based bulking agent twice a day. Use sugar-free sweetener when possible to avoid excessive calories.  o Eat a normal breakfast.  o Set aside 15 minutes after breakfast to sit on the toilet, but do not strain to have a bowel movement.  o If you do not have a bowel movement by the third day, use an enema and repeat the above steps.    Controlling diarrhea o Switch to liquids and simpler foods for a few days to avoid stressing your intestines further. o Avoid dairy products (especially milk & ice cream) for a short time.  The intestines often can lose the ability to digest lactose when stressed. o Avoid foods that cause gassiness or bloating.  Typical foods include beans and other legumes, cabbage, broccoli, and dairy foods.  Every person has some sensitivity to other foods, so listen to our  body and avoid those foods that trigger problems for you. o Adding fiber (Citrucel, Metamucil, psyllium, Miralax) gradually can help thicken stools by absorbing excess fluid and retrain the intestines to act more normally.  Slowly increase the dose over a few weeks.  Too much fiber too soon can backfire and cause cramping & bloating. o Probiotics (such as active yogurt, Align, etc) may help repopulate the intestines and colon with normal bacteria and calm down a sensitive digestive tract.  Most studies show it to be of mild help, though, and such products can be costly. o Medicines:   Bismuth subsalicylate (ex. Kayopectate, Pepto Bismol) every 30 minutes for up to 6 doses can help control diarrhea.  Avoid if pregnant.   Loperamide (Immodium) can slow down diarrhea.  Start with two tablets (4mg  total) first and then try one tablet every 6 hours.  Avoid if you are having fevers or severe pain.  If you are not better or start feeling worse, stop all medicines and call your doctor for advice o Call your doctor if you are getting worse or not better.  Sometimes further testing (cultures, endoscopy, X-ray studies, bloodwork, etc) may be needed to help diagnose and treat the cause of the diarrhea. o

## 2012-08-19 NOTE — Progress Notes (Signed)
Subjective:     Patient ID: Juan Mercado, male   DOB: [birth year], 26 y.o.   MRN: [id number]  HPI   Juan Mercado  08-Oct-1985 [id number]  Patient Care Team: Provider Not In System as PCP - General  This patient is a 25 y.o.male who presents today for surgical evaluation at the request of Felicita Gage PA-C & Linwood D[facility].   Reason for evaluation: Persistent painful anal warts  Pleasant young male who has struggled with hemorrhoids he says since 2005.  Swelling and bleeding.  Usually mild.  He noticed worsening pain in November 2012.  He went to the emergency room.  There was concern of possible thrombosed hemorrhoid.  It had gotten worse.  He returned emergency room in late May.  Again hemorrhoids.  He was sent to me for surgical evaluation.  Patient has had unprotected anal sex.  Struggles with constipation and usually has small bowel movements About every other day.  Difficult to have a good bowel movement.  He describes the pain as rather sharp and intense.  Occasionally burning.  Worse with bowel movements.  He notes flecks of blood in the toilet and when he wipes.  Tried Preparation H but gets burning and irritation with it.  He is hoping something can be done.  No prior history of any anorectal treatments or colonoscopy.  He's been tested recently and is HIV negative.  I set up surgery to ablate the perianal and anal canal condylomata.  Also EUA to rule out fissure.  For financial reasons, he did not schedule this.  He is now in a better financial place.  I recommended surgery.  For some reason, he was told to come in to be reevaluated.  Still struggling constipation.  Still hesitant to do any fiber bowel regimen.  Had not tried any Analpram yet.  Patient Active Problem List  Diagnosis  . Condyloma acuminatum, perianal & anal canal    Past Medical History  Diagnosis Date  . Hemorrhoids     No past surgical history on file.  History   Social History  . Marital Status:  Single    Spouse Name: N/A    Number of Children: N/A  . Years of Education: N/A   Occupational History  . Not on file.   Social History Main Topics  . Smoking status: Current Every Day Smoker -- 0.5 packs/day    Types: Cigarettes  . Smokeless tobacco: Never Used  . Alcohol Use: Yes     Comment: OCCASIONAL  . Drug Use: No  . Sexually Active: No   Other Topics Concern  . Not on file   Social History Narrative  . No narrative on file    No family history on file.  Current Outpatient Prescriptions  Medication Sig Dispense Refill  . hydrocortisone 2.5 % cream Apply topically 2 (two) times daily.      . naproxen sodium (ANAPROX) 220 MG tablet Take 440 mg by mouth 2 (two) times daily as needed. For pain      . Witch Hazel (TUCKS) 50 % PADS Apply topically.         No Known Allergies  BP 120/78  Pulse 60  Resp 16  Ht 5\' 9"  (1.753 m)  Wt 144 lb (65.318 kg)  BMI 21.27 kg/m2  No results found.   Review of Systems  Constitutional: Negative for fever, chills and diaphoresis.  HENT: Negative for nosebleeds, sore throat, facial swelling, mouth sores, trouble swallowing and ear  discharge.   Eyes: Negative for photophobia, discharge and visual disturbance.  Respiratory: Negative for choking, chest tightness, shortness of breath and stridor.   Cardiovascular: Negative for chest pain and palpitations.  Gastrointestinal: Positive for constipation, anal bleeding and rectal pain. Negative for nausea, vomiting, abdominal pain, diarrhea, blood in stool and abdominal distention.  Genitourinary: Negative for dysuria, urgency, difficulty urinating and testicular pain.  Musculoskeletal: Negative for myalgias, back pain, arthralgias and gait problem.  Skin: Negative for color change, pallor, rash and wound.  Neurological: Negative for dizziness, speech difficulty, weakness, numbness and headaches.  Hematological: Negative for adenopathy. Does not bruise/bleed easily.    Psychiatric/Behavioral: Negative for hallucinations, confusion and agitation.       Objective:   Physical Exam  Constitutional: He is oriented to person, place, and time. He appears well-developed and well-nourished. No distress.  HENT:  Head: Normocephalic.  Mouth/Throat: Oropharynx is clear and moist. No oropharyngeal exudate.  Eyes: Conjunctivae normal and EOM are normal. Pupils are equal, round, and reactive to light. No scleral icterus.  Neck: Normal range of motion. Neck supple. No tracheal deviation present.  Cardiovascular: Normal rate, regular rhythm and intact distal pulses.   Pulmonary/Chest: Effort normal and breath sounds normal. No respiratory distress.  Abdominal: Soft. He exhibits no distension and no mass. There is no tenderness. There is no guarding. Hernia confirmed negative in the right inguinal area and confirmed negative in the left inguinal area.  Genitourinary:       Exam done with assistance of male Medical Assistant in the room.  Perianal skin clean with good hygiene.  No pruritis.   No pilonidal disease.  No abscess/fistula.   High sphincter tone.    Numerous 3-11mm condylomata perianal 1cm out & into anal canal.  Very tender.  No definite posterior midline anal fissure.  Too sensitive to tolerate DRE  Musculoskeletal: Normal range of motion. He exhibits no tenderness.  Lymphadenopathy:    He has no cervical adenopathy.       Right: No inguinal adenopathy present.       Left: No inguinal adenopathy present.  Neurological: He is alert and oriented to person, place, and time. No cranial nerve deficit. He exhibits normal muscle tone. Coordination normal.  Skin: Skin is warm and dry. No rash noted. He is not diaphoretic. No erythema. No pallor.  Psychiatric: He has a normal mood and affect. His behavior is normal. Judgment and thought content normal.       Assessment:     Condyloma acuminatum of perianal region, especially into the anal canal with much pain  and discomfort.  No definite proof of fissure nor hemorrhoids    Plan:     He is quite miserable with these.  I recommended examination under anesthesia with removal of the anal warts.  Plan to do CO2 laser to minimize pain and discomfort.  Make sure there nor other problems such as hemorrhoids or a missed fissure.  I did discuss procedure with him:  The anatomy & physiology of the anorectal region was discussed.  The pathophysiology of anorectal warts and differential diagnosis was discussed.  Natural history risks without surgery was discussed such as further growth and cancer.   I stressed the importance of office follow-up to catch early recurrence & minimize/halt progression of disease.  Interventions such as cauterization by topical agents were discussed.  The patient's symptoms are not adequately controlled by non-operative treatments.  I feel the risks & problems of no surgery outweigh the operative  risks; therefore, I recommended surgery to treat the anal warts by removal, ablation and/or cauterization.  Risks such as bleeding, infection, need for further treatment, heart attack, death, and other risks were discussed.   I noted a good likelihood this will help address the problem. Goals of post-operative recovery were discussed as well.  Possibility that this will not correct all symptoms was explained.  Post-operative pain, bleeding, constipation, and other problems after surgery were discussed.  We will work to minimize complications.   Educational handouts further explaining the pathology, treatment options, and bowel regimen were given as well.  Questions were answered.  The patient expresses understanding & wishes to proceed with surgery.  He was very emotionally upset and quite shocked with the diagnosis.  After reexplaining a few times he seemed to calm down.  We'll work to coordinate this a convenient time.  He wants to get these taken care of so he can move on  I strongly recommend he  get a fiber regimen.  I noted his pain will get worse initially and he will really struggle if he does not get his stools more consistently soft.  I think he comprehended this.

## 2013-01-12 IMAGING — CT CT PELVIS W/ CM
1 series · 16 of 32 positions shown, 20 images · IV contrast (omnipaque)
Comparison: None.

CLINICAL DATA: Rectal pain and bleeding

CT PELVIS WITH CONTRAST
TECHNIQUE: Multidetector CT imaging of the pelvis was performed
using the standard protocol following bolus administration of
intravenous contrast.
Contrast: 100mL OMNIPAQUE IOHEXOL 300 MG/ML  SOLN

[Series 2: pelvis with · axial · 0.61mm/px · z∈[-510,-275]mm · 16 of 53 slices shown, 20 images]
[im 4/53  soft-tissue]
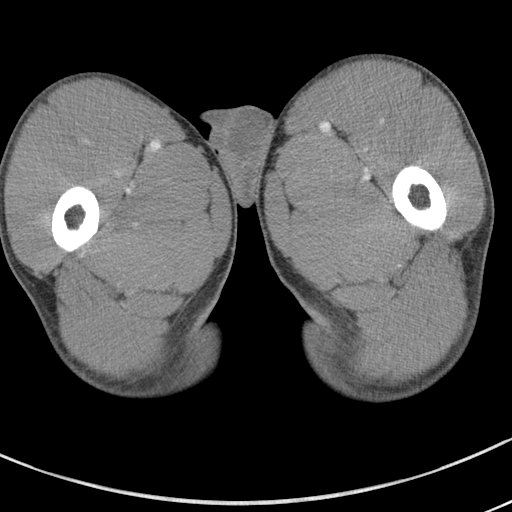
[im 4/53  bone]
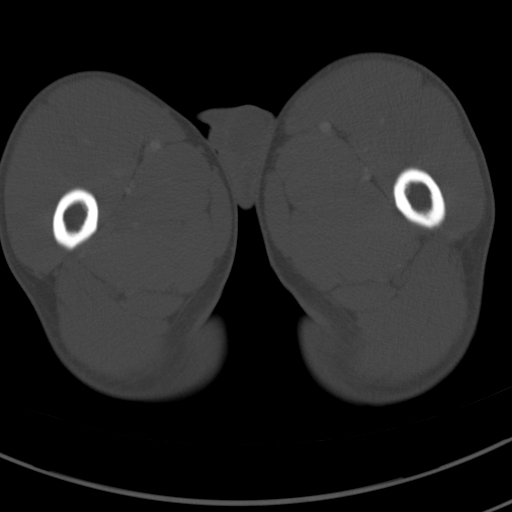
[im 7/53  soft-tissue]
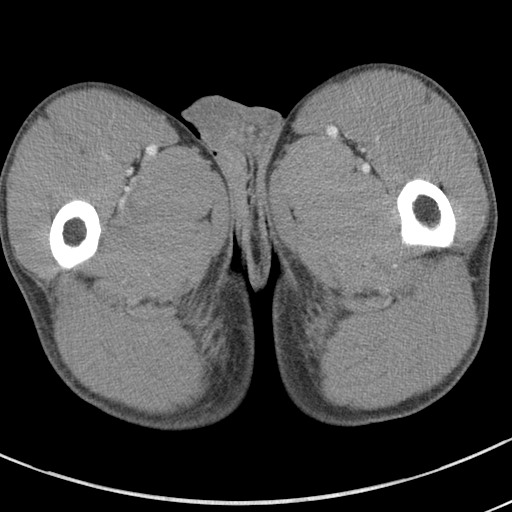
[im 11/53  soft-tissue]
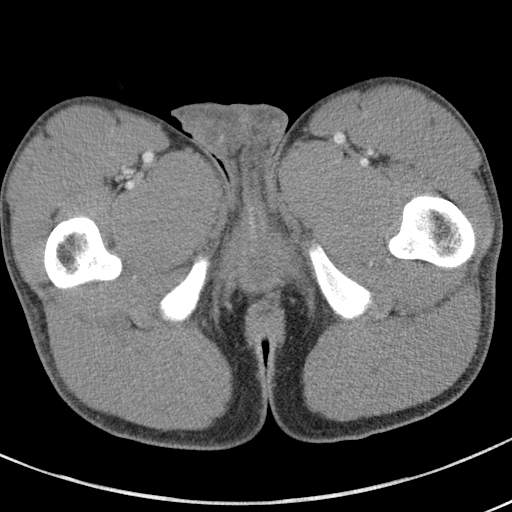
[im 14/53  soft-tissue]
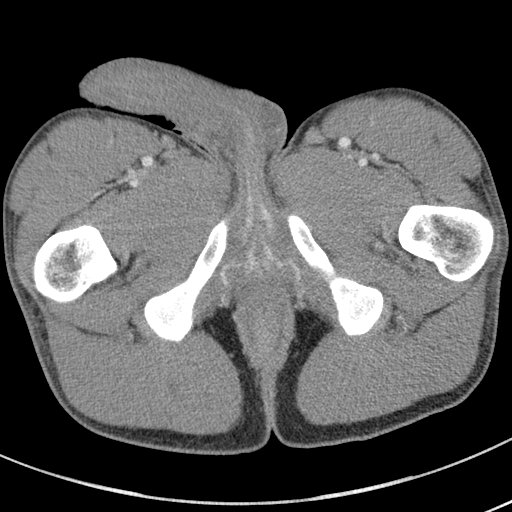
[im 17/53  soft-tissue]
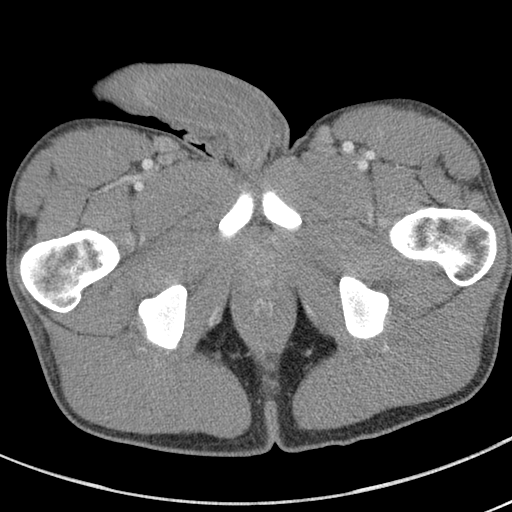
[im 21/53  soft-tissue]
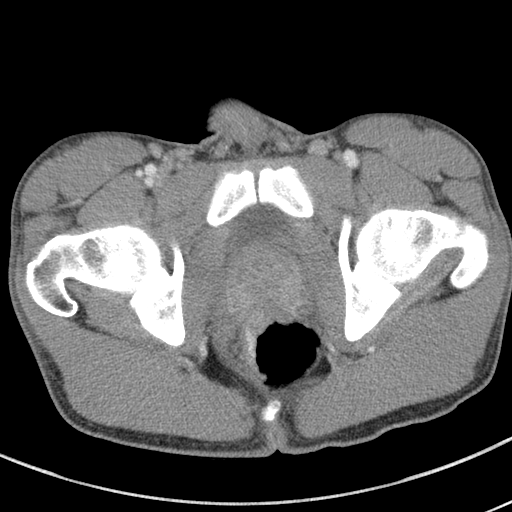
[im 24/53  soft-tissue]
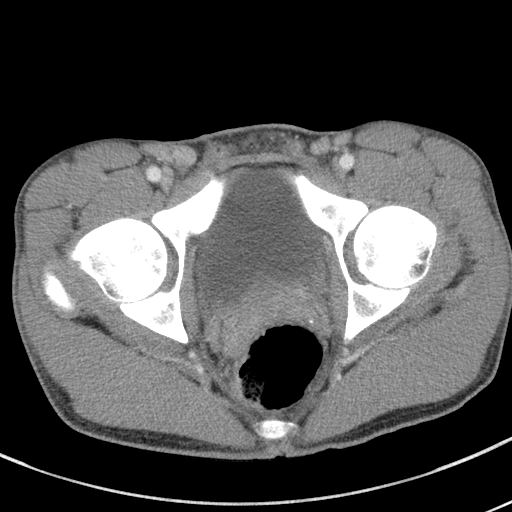
[im 29/53  soft-tissue]
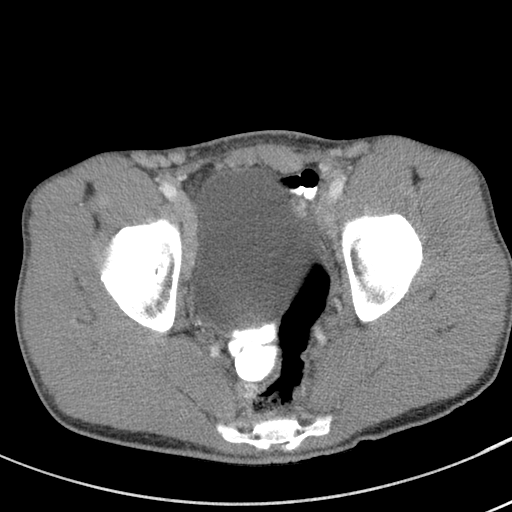
[im 32/53  soft-tissue]
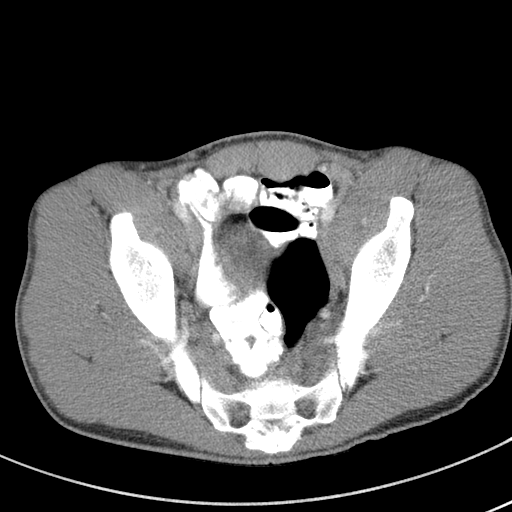
[im 32/53  bone]
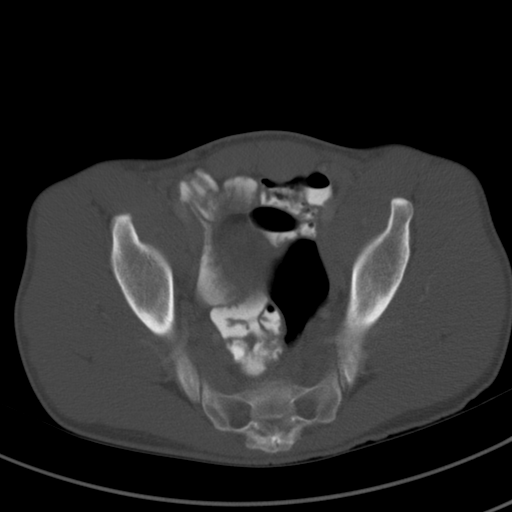
[im 36/53  soft-tissue]
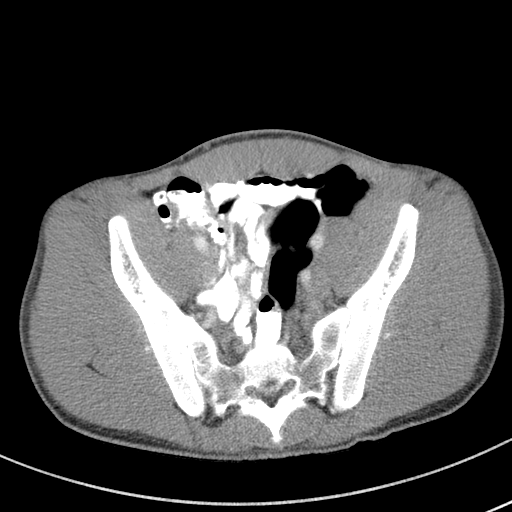
[im 39/53  soft-tissue]
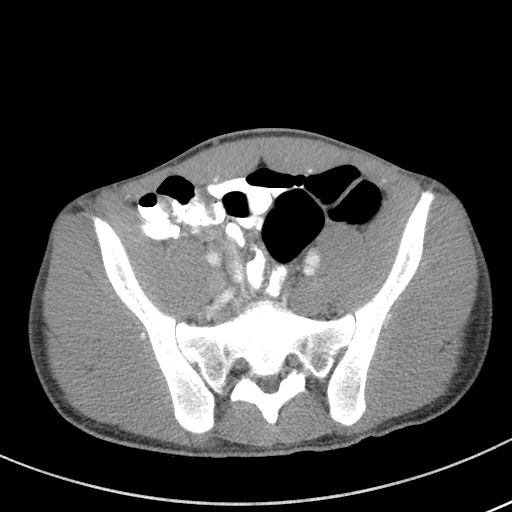
[im 42/53  soft-tissue]
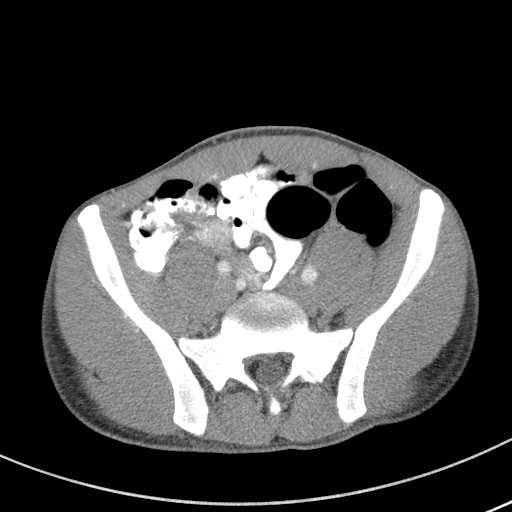
[im 46/53  soft-tissue]
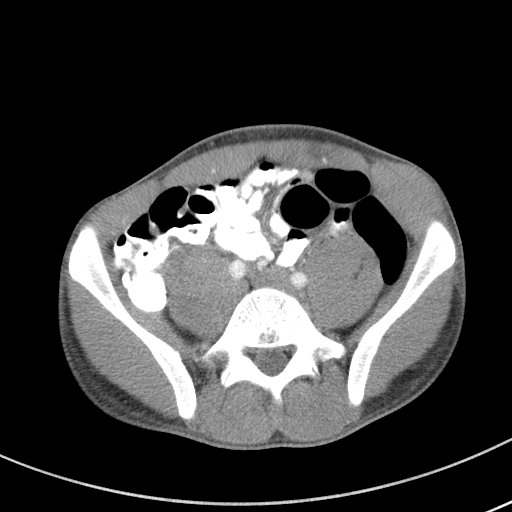
[im 46/53  lung]
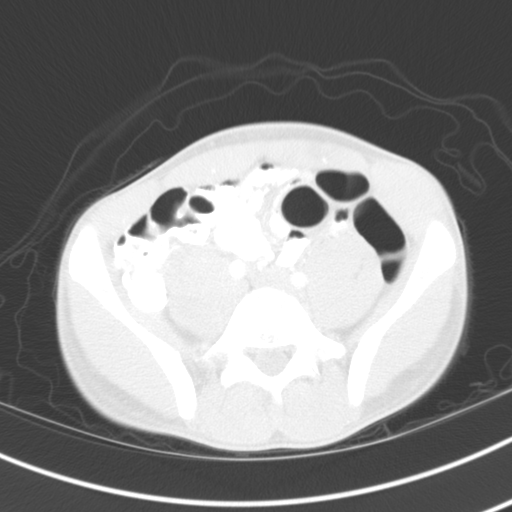
[im 47/53  lung]
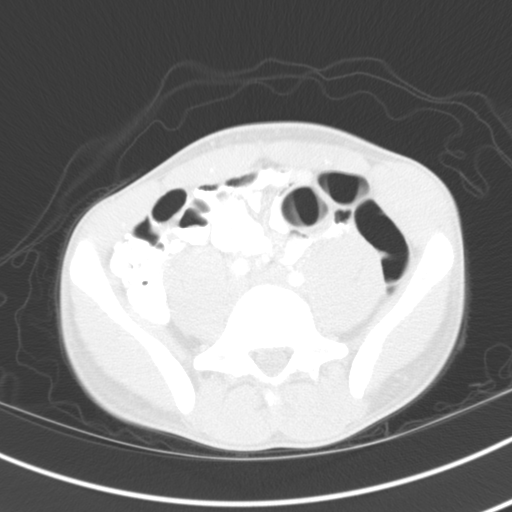
[im 49/53  soft-tissue]
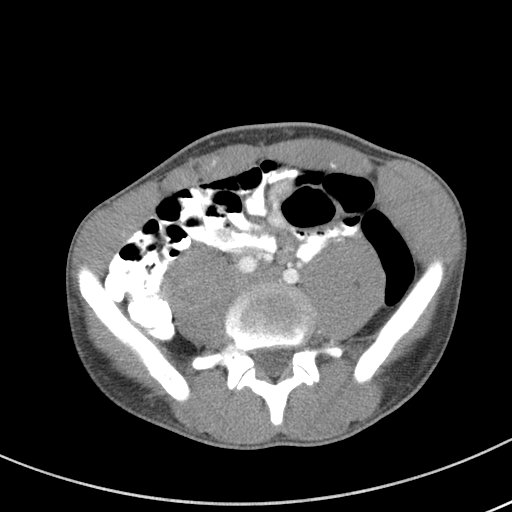
[im 49/53  lung]
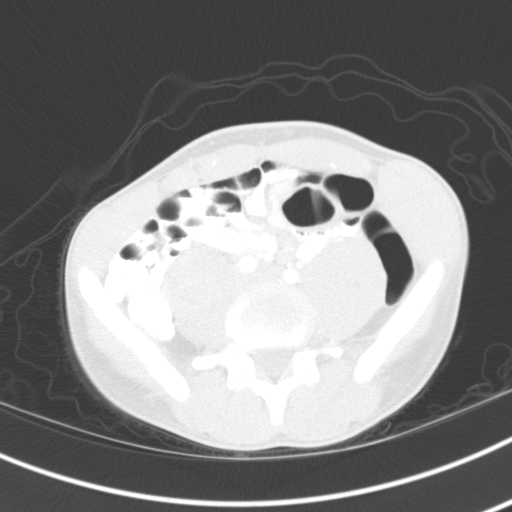
[im 51/53  lung]
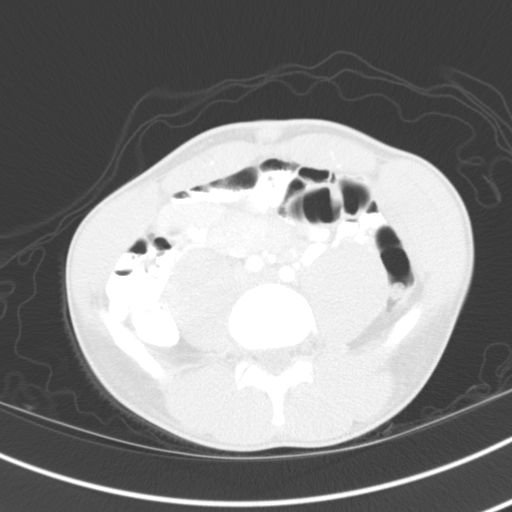

[16 of 32 positions shown; findings below may reference images not displayed]

FINDINGS: No evidence of rectal mass or perirectal abscess.
Unremarkable bladder and prostate.  No free fluid.  No
disproportionate dilatation of bowel.
IMPRESSION: No acute intrapelvic pathology.

## 2015-03-31 ENCOUNTER — Encounter (HOSPITAL_COMMUNITY): Payer: Self-pay | Admitting: Family Medicine

## 2015-03-31 ENCOUNTER — Emergency Department (HOSPITAL_COMMUNITY)
Admission: EM | Admit: 2015-03-31 | Discharge: 2015-03-31 | Disposition: A | Discharge status: Home / Self Care | Payer: Self-pay | Attending: Emergency Medicine | Admitting: Emergency Medicine

## 2015-03-31 DIAGNOSIS — Z720 Tobacco use: Secondary | ICD-10-CM | POA: Insufficient documentation

## 2015-03-31 DIAGNOSIS — K649 Unspecified hemorrhoids: Secondary | ICD-10-CM | POA: Insufficient documentation

## 2015-03-31 DIAGNOSIS — Z79899 Other long term (current) drug therapy: Secondary | ICD-10-CM | POA: Insufficient documentation

## 2015-03-31 DIAGNOSIS — A64 Unspecified sexually transmitted disease: Secondary | ICD-10-CM | POA: Insufficient documentation

## 2015-03-31 MED ORDER — CEFTRIAXONE SODIUM 250 MG IJ SOLR
250.0000 mg | Freq: Once | INTRAMUSCULAR | Status: AC
  Administered 2015-03-31: 250 mg via INTRAMUSCULAR
  Filled 2015-03-31: qty 250

## 2015-03-31 MED ORDER — LIDOCAINE HCL (PF) 1 % IJ SOLN
INTRAMUSCULAR | Status: AC
  Administered 2015-03-31: 5 mL
  Filled 2015-03-31: qty 5

## 2015-03-31 MED ORDER — AZITHROMYCIN 250 MG PO TABS
1000.0000 mg | ORAL_TABLET | Freq: Once | ORAL | Status: AC
  Administered 2015-03-31: 1000 mg via ORAL
  Filled 2015-03-31: qty 4

## 2015-03-31 NOTE — ED Notes (Signed)
Pt here for discharge from penis and exposure to STD.

## 2015-03-31 NOTE — Discharge Instructions (Signed)
You were seen in the ED today and diagnosed with an STD. You were treated for gonorrhea and Chlamydia. Please follow-up with your doctors or the health department for further evaluation and management of your symptoms. Return to ED for worsening symptoms.  Sexually Transmitted Disease A sexually transmitted disease (STD) is a disease or infection that may be passed (transmitted) from person to person, usually during sexual activity. This may happen by way of saliva, semen, blood, vaginal mucus, or urine. Common STDs include:   Gonorrhea.   Chlamydia.   Syphilis.   HIV and AIDS.   Genital herpes.   Hepatitis B and C.   Trichomonas.   Human papillomavirus (HPV).   Pubic lice.   Scabies.  Mites.  Bacterial vaginosis. WHAT ARE CAUSES OF STDs? An STD may be caused by bacteria, a virus, or parasites. STDs are often transmitted during sexual activity if one person is infected. However, they may also be transmitted through nonsexual means. STDs may be transmitted after:   Sexual intercourse with an infected person.   Sharing sex toys with an infected person.   Sharing needles with an infected person or using unclean piercing or tattoo needles.  Having intimate contact with the genitals, mouth, or rectal areas of an infected person.   Exposure to infected fluids during birth. WHAT ARE THE SIGNS AND SYMPTOMS OF STDs? Different STDs have different symptoms. Some people may not have any symptoms. If symptoms are present, they may include:   Painful or bloody urination.   Pain in the pelvis, abdomen, vagina, anus, throat, or eyes.   A skin rash, itching, or irritation.  Growths, ulcerations, blisters, or sores in the genital and anal areas.  Abnormal vaginal discharge with or without bad odor.   Penile discharge in men.   Fever.   Pain or bleeding during sexual intercourse.   Swollen glands in the groin area.   Yellow skin and eyes (jaundice). This  is seen with hepatitis.   Swollen testicles.  Infertility.  Sores and blisters in the mouth. HOW ARE STDs DIAGNOSED? To make a diagnosis, your health care provider may:   Take a medical history.   Perform a physical exam.   Take a sample of any discharge to examine.  Swab the throat, cervix, opening to the penis, rectum, or vagina for testing.  Test a sample of your first morning urine.   Perform blood tests.   Perform a Pap test, if this applies.   Perform a colposcopy.   Perform a laparoscopy.  HOW ARE STDs TREATED? Treatment depends on the STD. Some STDs may be treated but not cured.   Chlamydia, gonorrhea, trichomonas, and syphilis can be cured with antibiotic medicine.   Genital herpes, hepatitis, and HIV can be treated, but not cured, with prescribed medicines. The medicines lessen symptoms.   Genital warts from HPV can be treated with medicine or by freezing, burning (electrocautery), or surgery. Warts may come back.   HPV cannot be cured with medicine or surgery. However, abnormal areas may be removed from the cervix, vagina, or vulva.   If your diagnosis is confirmed, your recent sexual partners need treatment. This is true even if they are symptom-free or have a negative culture or evaluation. They should not have sex until their health care providers say it is okay. HOW CAN I REDUCE MY RISK OF GETTING AN STD? Take these steps to reduce your risk of getting an STD:  Use latex condoms, dental dams, and water-soluble lubricants  during sexual activity. Do not use petroleum jelly or oils.  Avoid having multiple sex partners.  Do not have sex with someone who has other sex partners.  Do not have sex with anyone you do not know or who is at high risk for an STD.  Avoid risky sex practices that can break your skin.  Do not have sex if you have open sores on your mouth or skin.  Avoid drinking too much alcohol or taking illegal drugs. Alcohol and  drugs can affect your judgment and put you in a vulnerable position.  Avoid engaging in oral and anal sex acts.  Get vaccinated for HPV and hepatitis. If you have not received these vaccines in the past, talk to your health care provider about whether one or both might be right for you.   If you are at risk of being infected with HIV, it is recommended that you take a prescription medicine daily to prevent HIV infection. This is called pre-exposure prophylaxis (PrEP). You are considered at risk if:  You are a man who has sex with other men (MSM).  You are a heterosexual man or woman and are sexually active with more than one partner.  You take drugs by injection.  You are sexually active with a partner who has HIV.  Talk with your health care provider about whether you are at high risk of being infected with HIV. If you choose to begin PrEP, you should first be tested for HIV. You should then be tested every 3 months for as long as you are taking PrEP.  WHAT SHOULD I DO IF I THINK I HAVE AN STD?  See your health care provider.   Tell your sexual partner(s). They should be tested and treated for any STDs.  Do not have sex until your health care provider says it is okay. WHEN SHOULD I GET IMMEDIATE MEDICAL CARE? Contact your health care provider right away if:   You have severe abdominal pain.  You are a man and notice swelling or pain in your testicles.  You are a woman and notice swelling or pain in your vagina. Document Released: 11/25/2002 Document Revised: 09/09/2013 Document Reviewed: 03/25/2013 [facility] Patient Information 2015 [employer], [place]. This information is not intended to replace advice given to you by your health care provider. Make sure you discuss any questions you have with your health care provider.

## 2015-03-31 NOTE — ED Provider Notes (Signed)
CSN: [id number]     Arrival date & time 03/31/15  1314 History  This chart was scribed for non-physician practitioner Joycie PeekBenjamin Aishah Teffeteller, PA-C working with Raeford RazorStephen Kohut, MD by Lyndel SafeKaitlyn Shelton, ED Scribe. This patient was seen in room TR06C/TR06C and the patient's care was started at 2:28 PM.   Chief Complaint  Patient presents with  . SEXUALLY TRANSMITTED DISEASE   The history is provided by the patient. No language interpreter was used.   HPI Comments: Juan Mercado is a 29 y.o. male, who is sexually active, presents to the Emergency Department complaining of sudden onset, intermittent penile discharge with pain and swelling and constant dysuria s/p subjective exposure to STD. He also complains of constant, aching right-sided abdominal pain onset this morning that is exacerbated upon urination. Pt reports he has had gonorrhea in the past and is experiencing similar symptoms. He notes an episode of unprotected sex with a male partner 4 nights ago. Denies fevers, nausea, vomiting, decreased appetite, or any pertinent PMhx or PShx.   Past Medical History  Diagnosis Date  . Hemorrhoids    History reviewed. No pertinent past surgical history. History reviewed. No pertinent family history. History  Substance Use Topics  . Smoking status: Current Every Day Smoker -- 0.50 packs/day    Types: Cigarettes  . Smokeless tobacco: Never Used  . Alcohol Use: Yes     Comment: OCCASIONAL    Review of Systems  Constitutional: Negative for fever and appetite change.  Gastrointestinal: Positive for abdominal pain. Negative for nausea and vomiting.  Genitourinary: Positive for dysuria, discharge, penile swelling and penile pain.  All other systems reviewed and are negative.   Allergies  Review of patient's allergies indicates no known allergies.  Home Medications   Prior to Admission medications   Medication Sig Start Date End Date Taking? Authorizing Provider  hydrocortisone 2.5 % cream Apply  topically 2 (two) times daily.    Historical Provider, MD  naproxen sodium (ANAPROX) 220 MG tablet Take 440 mg by mouth 2 (two) times daily as needed. For pain    Historical Provider, MD  Witch Hazel (TUCKS) 50 % PADS Apply topically.    Historical Provider, MD   BP 117/93 mmHg  Pulse 60  Temp(Src) 98 F (36.7 C) (Oral)  Resp 18  Ht 5\' 10"  (1.778 m)  Wt 148 lb 1.6 oz (67.178 kg)  BMI 21.25 kg/m2  SpO2 100% Physical Exam  Constitutional: He appears well-developed and well-nourished.  HENT:  Head: Normocephalic and atraumatic.  Eyes: Conjunctivae are normal. Right eye exhibits no discharge. Left eye exhibits no discharge.  Cardiovascular: Normal rate, regular rhythm and normal heart sounds.   Pulmonary/Chest: Effort normal and breath sounds normal. No respiratory distress.  Lungs clear to ausculation bilaterally.   Abdominal: Soft. Bowel sounds are normal. He exhibits no distension. There is tenderness. There is no rebound and no guarding.  Mild tenderness over suprapubic region; negative for both McBurney's and Murphy's point tenderness.   Neurological: He is alert. Coordination normal.  Skin: Skin is warm and dry. No rash noted. He is not diaphoretic. No erythema.  Psychiatric: He has a normal mood and affect.  Nursing note and vitals reviewed.  ED Course  Procedures  DIAGNOSTIC STUDIES: Oxygen Saturation is 100% on RA, normal by my interpretation.    COORDINATION OF CARE: 2:36 PM Discussed treatment plan which includes to order diagnostic labs including a urinalysis. Pt agrees to plan to treat for gonorrhea and chlamydia. Discussed with pt he will  receive a call in 48 hours only if the test is positive. Pt acknowledges and agrees to plan.   Labs Review Labs Reviewed  HIV ANTIBODY (ROUTINE TESTING)  URINALYSIS, ROUTINE W REFLEX MICROSCOPIC (NOT AT [facility])  GC/CHLAMYDIA PROBE AMP ([place]) NOT AT [facility]    Imaging Review No results found.   EKG Interpretation None      Meds given in ED:  Medications  cefTRIAXone (ROCEPHIN) injection 250 mg (not administered)  azithromycin (ZITHROMAX) tablet 1,000 mg (not administered)    New Prescriptions   No medications on file   Filed Vitals:   03/31/15 1347  BP: 117/93  Pulse: 60  Temp: 98 F (36.7 C)  TempSrc: Oral  Resp: 18  Height:  (1.778 m)  Weight: 148 lb 1.6 oz (67.178 kg)  SpO2: 100%    MDM  Vitals stable - WNL -afebrile Pt resting comfortably in ED. PE--mild discomfort over superpubic region, abdominal exam otherwise benign. Labwork--pending GC chlamydia, patient does not want to wait on urinalysis results.  DDX--patient does not want any other testing or evaluation at this time. States that he "only wanted to have the shot for gonorrhea". Treated empirically in the ED.  I discussed all relevant lab findings and imaging results with pt and they verbalized understanding. Discussed f/u with PCP within 48 hrs and return precautions, pt very amenable to plan.  Final diagnoses:  STD (male)    I personally performed the services described in this documentation, which was scribed in my presence. The recorded information has been reviewed and is accurate.    Joycie Peek, PA-C 03/31/15 1457  Raeford Razor, MD 04/02/15 [phone]

## 2015-04-01 LAB — GC/CHLAMYDIA PROBE AMP (~~LOC~~) NOT AT ARMC
CHLAMYDIA, DNA PROBE: NEGATIVE
Neisseria Gonorrhea: POSITIVE — AB

## 2015-04-02 ENCOUNTER — Telehealth (HOSPITAL_COMMUNITY): Payer: Self-pay

## 2015-04-02 NOTE — ED Notes (Signed)
Positive for gonorrhea. Treated per protocol. Attempting to contact pt.  

## 2015-04-03 ENCOUNTER — Telehealth: Payer: Self-pay | Admitting: Emergency Medicine

## 2015-04-04 ENCOUNTER — Telehealth (HOSPITAL_BASED_OUTPATIENT_CLINIC_OR_DEPARTMENT_OTHER): Payer: Self-pay | Admitting: Emergency Medicine

## 2015-05-10 ENCOUNTER — Telehealth (HOSPITAL_BASED_OUTPATIENT_CLINIC_OR_DEPARTMENT_OTHER): Payer: Self-pay

## 2015-05-10 NOTE — Telephone Encounter (Signed)
Unable to contact pt by mail or telephone. Unable to communicate lab results or treatment changes. 

## 2020-12-02 ENCOUNTER — Telehealth: Admit: 2020-12-02 | Discharge: 2020-12-03

## 2020-12-02 DIAGNOSIS — Z21 Asymptomatic human immunodeficiency virus [HIV] infection status: Principal | ICD-10-CM

## 2020-12-03 ENCOUNTER — Ambulatory Visit: Admit: 2020-12-03

## 2020-12-03 DIAGNOSIS — Z21 Asymptomatic human immunodeficiency virus [HIV] infection status: Principal | ICD-10-CM

## 2020-12-03 MED ORDER — ELVITEG 150 MG-COB 150 MG-EMTRICIT 200 MG-TENOFO ALAFENAM 10 MG TABLET
ORAL_TABLET | Freq: Every day | ORAL | 2 refills | 30 days | Status: CP
Start: 2020-12-03 — End: 2021-01-02

## 2020-12-23 DIAGNOSIS — Z9189 Other specified personal risk factors, not elsewhere classified: Principal | ICD-10-CM

## 2021-01-06 ENCOUNTER — Ambulatory Visit: Admit: 2021-01-06

## 2021-01-24 ENCOUNTER — Ambulatory Visit: Admit: 2021-01-24 | Discharge: 2021-01-25

## 2021-01-24 DIAGNOSIS — Z8619 Personal history of other infectious and parasitic diseases: Principal | ICD-10-CM

## 2021-01-24 DIAGNOSIS — Z21 Asymptomatic human immunodeficiency virus [HIV] infection status: Principal | ICD-10-CM

## 2021-03-10 DIAGNOSIS — Z21 Asymptomatic human immunodeficiency virus [HIV] infection status: Principal | ICD-10-CM

## 2021-03-10 MED ORDER — GENVOYA 150 MG-150 MG-200 MG-10 MG TABLET
ORAL_TABLET | 2 refills | 0 days | Status: CP
Start: 2021-03-10 — End: ?

## 2021-05-17 ENCOUNTER — Ambulatory Visit: Admit: 2021-05-17 | Payer: PRIVATE HEALTH INSURANCE

## 2021-06-17 ENCOUNTER — Ambulatory Visit: Admit: 2021-06-17 | Discharge: 2021-06-18 | Payer: PRIVATE HEALTH INSURANCE

## 2021-06-17 DIAGNOSIS — Z21 Asymptomatic human immunodeficiency virus [HIV] infection status: Principal | ICD-10-CM

## 2021-06-17 DIAGNOSIS — M545 Acute left-sided low back pain without sciatica: Principal | ICD-10-CM

## 2021-06-17 MED ORDER — CYCLOBENZAPRINE 10 MG TABLET
ORAL_TABLET | Freq: Three times a day (TID) | ORAL | 0 refills | 7 days | Status: CP | PRN
Start: 2021-06-17 — End: ?

## 2021-06-17 MED ORDER — MELOXICAM 15 MG TABLET
ORAL_TABLET | Freq: Every day | ORAL | 0 refills | 30 days | Status: CP | PRN
Start: 2021-06-17 — End: ?

## 2021-06-21 MED ORDER — SULFAMETHOXAZOLE 800 MG-TRIMETHOPRIM 160 MG TABLET
ORAL_TABLET | Freq: Every day | ORAL | 11 refills | 30.00000 days | Status: CP
Start: 2021-06-21 — End: ?

## 2021-06-22 DIAGNOSIS — Z21 Asymptomatic human immunodeficiency virus [HIV] infection status: Principal | ICD-10-CM

## 2021-07-05 ENCOUNTER — Ambulatory Visit: Admit: 2021-07-05 | Payer: PRIVATE HEALTH INSURANCE

## 2021-07-14 ENCOUNTER — Ambulatory Visit: Admit: 2021-07-14 | Payer: PRIVATE HEALTH INSURANCE

## 2021-07-15 ENCOUNTER — Ambulatory Visit: Admit: 2021-07-15 | Payer: PRIVATE HEALTH INSURANCE

## 2021-07-18 ENCOUNTER — Ambulatory Visit: Admit: 2021-07-18 | Payer: PRIVATE HEALTH INSURANCE

## 2021-08-15 ENCOUNTER — Ambulatory Visit: Admit: 2021-08-15 | Payer: PRIVATE HEALTH INSURANCE

## 2021-08-15 DIAGNOSIS — Z21 Asymptomatic human immunodeficiency virus [HIV] infection status: Principal | ICD-10-CM

## 2021-09-06 ENCOUNTER — Ambulatory Visit: Admit: 2021-09-06 | Payer: PRIVATE HEALTH INSURANCE

## 2021-09-30 ENCOUNTER — Other Ambulatory Visit: Admit: 2021-09-30 | Discharge: 2021-10-01 | Payer: PRIVATE HEALTH INSURANCE

## 2021-09-30 DIAGNOSIS — Z21 Asymptomatic human immunodeficiency virus [HIV] infection status: Principal | ICD-10-CM

## 2021-10-06 ENCOUNTER — Ambulatory Visit: Admit: 2021-10-06 | Discharge: 2021-10-07 | Payer: PRIVATE HEALTH INSURANCE

## 2021-10-17 DIAGNOSIS — Z21 Asymptomatic human immunodeficiency virus [HIV] infection status: Principal | ICD-10-CM

## 2021-10-17 MED ORDER — BIKTARVY 50 MG-200 MG-25 MG TABLET
ORAL_TABLET | Freq: Every day | ORAL | 5 refills | 30 days | Status: CP
Start: 2021-10-17 — End: ?

## 2021-10-18 MED ORDER — DOXYCYCLINE HYCLATE 100 MG TABLET
ORAL_TABLET | Freq: Two times a day (BID) | ORAL | 0 refills | 7 days | Status: CP
Start: 2021-10-18 — End: 2021-10-25

## 2022-01-26 ENCOUNTER — Ambulatory Visit: Admit: 2022-01-26 | Discharge: 2022-01-27 | Payer: PRIVATE HEALTH INSURANCE

## 2022-01-27 DIAGNOSIS — Z21 Asymptomatic human immunodeficiency virus [HIV] infection status: Principal | ICD-10-CM

## 2022-01-27 MED ORDER — ONDANSETRON HCL 4 MG TABLET
ORAL_TABLET | Freq: Three times a day (TID) | ORAL | 0 refills | 10 days | Status: CP | PRN
Start: 2022-01-27 — End: 2022-02-06

## 2022-01-27 MED ORDER — SYMTUZA 800 MG-150 MG-200 MG-10 MG TABLET
ORAL_TABLET | Freq: Every day | ORAL | 5 refills | 0 days | Status: CP
Start: 2022-01-27 — End: ?

## 2022-03-02 ENCOUNTER — Ambulatory Visit: Admit: 2022-03-02 | Payer: PRIVATE HEALTH INSURANCE

## 2022-04-07 ENCOUNTER — Ambulatory Visit: Admit: 2022-04-07 | Payer: PRIVATE HEALTH INSURANCE

## 2022-05-18 ENCOUNTER — Ambulatory Visit: Admit: 2022-05-18 | Discharge: 2022-05-19

## 2022-05-18 MED ORDER — PERMETHRIN 5 % TOPICAL CREAM
Freq: Once | TOPICAL | 0 refills | 120 days | Status: CP
Start: 2022-05-18 — End: 2022-05-18

## 2022-05-18 MED ORDER — SULFAMETHOXAZOLE 800 MG-TRIMETHOPRIM 160 MG TABLET
ORAL_TABLET | Freq: Every day | ORAL | 0 refills | 30 days | Status: CP
Start: 2022-05-18 — End: ?

## 2022-05-18 MED ORDER — SYMTUZA 800 MG-150 MG-200 MG-10 MG TABLET
ORAL_TABLET | Freq: Every day | ORAL | 0 refills | 0 days | Status: CP
Start: 2022-05-18 — End: ?

## 2022-05-22 DIAGNOSIS — Z21 Asymptomatic human immunodeficiency virus [HIV] infection status: Principal | ICD-10-CM

## 2022-06-26 NOTE — Unmapped (Signed)
[facility] Specialty Medication Onboarding    Specialty Medication: SYMTUZA [id number] mg Tab (darunavir-cobi-emtri-tenof ala)  Prior Authorization: Not Required   Financial Assistance: No - MAPs has reached maximum number of attempts to obtain necessary information from the patient in regards to the financial assistance process  Final Copay/Day Supply: cash price / 30    Insurance Restrictions: None     Notes to Pharmacist:     The triage team has completed the benefits investigation and has determined that the patient is able to fill this medication at [facility]. Please contact the patient to complete the onboarding or follow up with the prescribing physician as needed.

## 2022-07-04 NOTE — Unmapped (Signed)
Specialty Medication(s): Symtuza    Marcus York has been dis-enrolled from the [facility] specialty pharmacy services due to multiple unsuccessful outreach attempts by the pharmacy MAPS technician to obtain the necessary documents to seek manufacturer's assistance.Marland Kitchen    He can be re-enrolled if needed.    Additional information provided to the patient: n/a    Roderic Palau, PharmD  [facility] Specialty Pharmacist

## 2022-07-07 ENCOUNTER — Ambulatory Visit: Admit: 2022-07-07

## 2022-09-07 ENCOUNTER — Ambulatory Visit: Admit: 2022-09-07

## 2022-09-21 ENCOUNTER — Ambulatory Visit: Admit: 2022-09-21 | Discharge: 2022-09-22

## 2022-09-21 MED ORDER — PERMETHRIN 5 % TOPICAL CREAM
Freq: Once | TOPICAL | 0 refills | 120 days | Status: CP
Start: 2022-09-21 — End: 2022-09-21

## 2022-09-21 MED ORDER — CETIRIZINE 10 MG TABLET
ORAL_TABLET | Freq: Every day | ORAL | 2 refills | 30 days | Status: CP
Start: 2022-09-21 — End: 2023-09-21

## 2022-09-21 MED ORDER — DESCOVY 200 MG-25 MG TABLET
ORAL_TABLET | Freq: Every day | ORAL | 0 refills | 30 days | Status: CP
Start: 2022-09-21 — End: ?

## 2022-09-21 MED ORDER — ADAPALENE 0.1 % TOPICAL CREAM
Freq: Every evening | TOPICAL | 2 refills | 0 days | Status: CP
Start: 2022-09-21 — End: 2023-09-21

## 2022-09-21 MED ORDER — PREZCOBIX 800 MG-150 MG TABLET
ORAL_TABLET | Freq: Every day | ORAL | 0 refills | 30 days | Status: CP
Start: 2022-09-21 — End: ?

## 2022-10-26 ENCOUNTER — Ambulatory Visit: Admit: 2022-10-26

## 2022-11-03 ENCOUNTER — Ambulatory Visit: Admit: 2022-11-03 | Discharge: 2022-11-04

## 2022-11-03 MED ORDER — HYDROCORTISONE ACETATE 25 MG RECTAL SUPPOSITORY
Freq: Two times a day (BID) | RECTAL | 0 refills | 14 days | Status: CP
Start: 2022-11-03 — End: ?

## 2022-11-03 MED ORDER — SYMTUZA 800 MG-150 MG-200 MG-10 MG TABLET
ORAL_TABLET | Freq: Every day | ORAL | 11 refills | 0 days | Status: CP
Start: 2022-11-03 — End: ?

## 2022-12-01 ENCOUNTER — Ambulatory Visit: Admit: 2022-12-01

## 2023-02-01 ENCOUNTER — Ambulatory Visit: Admit: 2023-02-01

## 2023-03-23 ENCOUNTER — Ambulatory Visit: Admit: 2023-03-23 | Discharge: 2023-03-24

## 2023-03-23 DIAGNOSIS — M545 Acute bilateral low back pain without sciatica: Principal | ICD-10-CM

## 2023-03-23 DIAGNOSIS — Z21 Asymptomatic human immunodeficiency virus [HIV] infection status: Principal | ICD-10-CM

## 2023-03-23 DIAGNOSIS — R21 Rash and other nonspecific skin eruption: Principal | ICD-10-CM

## 2023-03-23 DIAGNOSIS — F411 Generalized anxiety disorder: Principal | ICD-10-CM

## 2023-03-23 MED ORDER — PERMETHRIN 5 % TOPICAL CREAM
Freq: Once | TOPICAL | 0 refills | 120 days | Status: CP
Start: 2023-03-23 — End: 2023-03-23

## 2023-03-23 MED ORDER — IVERMECTIN 3 MG TABLET
ORAL_TABLET | 0 refills | 0 days | Status: CP
Start: 2023-03-23 — End: ?

## 2023-03-23 MED ORDER — CYCLOBENZAPRINE 10 MG TABLET
ORAL_TABLET | Freq: Three times a day (TID) | ORAL | 0 refills | 7 days | Status: CP | PRN
Start: 2023-03-23 — End: ?

## 2023-03-23 MED ORDER — BUSPIRONE 5 MG TABLET
ORAL_TABLET | Freq: Two times a day (BID) | ORAL | 5 refills | 30 days | Status: CP
Start: 2023-03-23 — End: 2024-03-22

## 2023-04-05 ENCOUNTER — Ambulatory Visit: Admit: 2023-04-05 | Discharge: 2023-04-06

## 2023-04-05 DIAGNOSIS — Z21 Asymptomatic human immunodeficiency virus [HIV] infection status: Principal | ICD-10-CM

## 2023-04-05 DIAGNOSIS — R21 Rash and other nonspecific skin eruption: Principal | ICD-10-CM

## 2023-04-05 MED ORDER — MUPIROCIN 2 % TOPICAL OINTMENT
Freq: Three times a day (TID) | TOPICAL | 2 refills | 0 days | Status: CP
Start: 2023-04-05 — End: 2023-04-12

## 2023-04-05 MED ORDER — SULFAMETHOXAZOLE 800 MG-TRIMETHOPRIM 160 MG TABLET
ORAL_TABLET | Freq: Every day | ORAL | 5 refills | 30 days | Status: CP
Start: 2023-04-05 — End: ?

## 2023-04-05 MED ORDER — AZITHROMYCIN 600 MG TABLET
ORAL_TABLET | ORAL | 5 refills | 28 days | Status: CP
Start: 2023-04-05 — End: ?

## 2023-04-19 ENCOUNTER — Ambulatory Visit: Admit: 2023-04-19

## 2023-05-03 ENCOUNTER — Ambulatory Visit: Admit: 2023-05-03

## 2023-07-05 ENCOUNTER — Ambulatory Visit: Admit: 2023-07-05

## 2023-08-24 ENCOUNTER — Ambulatory Visit: Admit: 2023-08-24 | Discharge: 2023-08-25

## 2023-08-24 DIAGNOSIS — R509 Fever, unspecified: Principal | ICD-10-CM

## 2023-08-24 DIAGNOSIS — R0602 Shortness of breath: Principal | ICD-10-CM

## 2023-08-24 DIAGNOSIS — R61 Generalized hyperhidrosis: Principal | ICD-10-CM

## 2023-08-24 DIAGNOSIS — Z21 Asymptomatic human immunodeficiency virus [HIV] infection status: Principal | ICD-10-CM

## 2023-08-25 DIAGNOSIS — Z21 Asymptomatic human immunodeficiency virus [HIV] infection status: Principal | ICD-10-CM

## 2023-08-25 DIAGNOSIS — R509 Fever, unspecified: Principal | ICD-10-CM

## 2023-08-25 DIAGNOSIS — R61 Generalized hyperhidrosis: Principal | ICD-10-CM

## 2023-08-26 DIAGNOSIS — Z21 Asymptomatic human immunodeficiency virus [HIV] infection status: Principal | ICD-10-CM

## 2023-08-26 DIAGNOSIS — R509 Fever, unspecified: Principal | ICD-10-CM

## 2023-08-26 DIAGNOSIS — R61 Generalized hyperhidrosis: Principal | ICD-10-CM

## 2023-08-28 DIAGNOSIS — A515 Early syphilis, latent: Principal | ICD-10-CM

## 2023-08-29 ENCOUNTER — Ambulatory Visit: Admit: 2023-08-29 | Discharge: 2023-08-30

## 2023-08-29 DIAGNOSIS — R0602 Shortness of breath: Principal | ICD-10-CM

## 2023-08-29 DIAGNOSIS — R509 Fever, unspecified: Principal | ICD-10-CM

## 2023-08-29 DIAGNOSIS — Z21 Asymptomatic human immunodeficiency virus [HIV] infection status: Principal | ICD-10-CM

## 2023-08-29 DIAGNOSIS — R61 Generalized hyperhidrosis: Principal | ICD-10-CM

## 2023-08-30 ENCOUNTER — Ambulatory Visit: Admit: 2023-08-30 | Discharge: 2023-08-31

## 2023-08-30 MED ORDER — SULFAMETHOXAZOLE 800 MG-TRIMETHOPRIM 160 MG TABLET
ORAL_TABLET | Freq: Three times a day (TID) | ORAL | 0 refills | 21.00 days | Status: CP
Start: 2023-08-30 — End: 2023-09-20

## 2023-09-06 ENCOUNTER — Ambulatory Visit: Admit: 2023-09-06 | Discharge: 2023-09-07

## 2023-09-06 DIAGNOSIS — Z8619 Personal history of other infectious and parasitic diseases: Principal | ICD-10-CM

## 2023-09-06 DIAGNOSIS — Z21 Asymptomatic human immunodeficiency virus [HIV] infection status: Principal | ICD-10-CM

## 2023-09-06 DIAGNOSIS — B59 Pneumocystosis: Principal | ICD-10-CM

## 2023-09-06 MED ORDER — SULFAMETHOXAZOLE 800 MG-TRIMETHOPRIM 160 MG TABLET
ORAL_TABLET | Freq: Three times a day (TID) | ORAL | 0 refills | 21.00 days | Status: CP
Start: 2023-09-06 — End: 2023-09-27

## 2023-11-01 ENCOUNTER — Ambulatory Visit: Admit: 2023-11-01 | Payer: BLUE CROSS/BLUE SHIELD

## 2023-11-09 ENCOUNTER — Ambulatory Visit: Admit: 2023-11-09 | Payer: BLUE CROSS/BLUE SHIELD

## 2023-11-18 DIAGNOSIS — Z21 Asymptomatic human immunodeficiency virus [HIV] infection status: Principal | ICD-10-CM

## 2023-11-18 MED ORDER — ETHAMBUTOL 400 MG TABLET
ORAL_TABLET | Freq: Every day | ORAL | 5 refills | 30.00 days | Status: CP
Start: 2023-11-18 — End: 2024-11-17

## 2023-11-18 MED ORDER — BICTEGRAVIR 50 MG-EMTRICITABINE 200 MG-TENOFOVIR ALAFENAM 25 MG TABLET
ORAL_TABLET | Freq: Every day | ORAL | 5 refills | 30.00 days | Status: CP
Start: 2023-11-18 — End: ?

## 2023-11-18 MED ORDER — CHOLECALCIFEROL (VITAMIN D3) 125 MCG/ML (5,000 UNIT/ML) ORAL DROPS
ORAL | 1 refills | 28.00 days | Status: CP
Start: 2023-11-18 — End: 2023-12-18

## 2023-11-18 MED ORDER — AZITHROMYCIN 600 MG TABLET
ORAL_TABLET | Freq: Every day | ORAL | 5 refills | 30.00 days | Status: CP
Start: 2023-11-18 — End: ?

## 2023-11-22 ENCOUNTER — Ambulatory Visit: Admit: 2023-11-22 | Discharge: 2023-11-23 | Payer: BLUE CROSS/BLUE SHIELD

## 2023-11-27 DIAGNOSIS — Z21 Asymptomatic human immunodeficiency virus [HIV] infection status: Principal | ICD-10-CM

## 2023-11-28 ENCOUNTER — Other Ambulatory Visit: Admit: 2023-11-28 | Discharge: 2023-11-29 | Payer: BLUE CROSS/BLUE SHIELD

## 2023-12-07 DIAGNOSIS — Z21 Asymptomatic human immunodeficiency virus [HIV] infection status: Principal | ICD-10-CM

## 2023-12-07 MED ORDER — BICTEGRAVIR 50 MG-EMTRICITABINE 200 MG-TENOFOVIR ALAFENAM 25 MG TABLET
ORAL_TABLET | Freq: Every day | ORAL | 5 refills | 30.00 days
Start: 2023-12-07 — End: ?

## 2023-12-13 ENCOUNTER — Encounter: Admit: 2023-12-13 | Discharge: 2023-12-14

## 2023-12-13 MED ORDER — MELATONIN 3 MG TABLET
ORAL_TABLET | Freq: Every evening | ORAL | 2 refills | 30 days | Status: CP | PRN
Start: 2023-12-13 — End: ?

## 2023-12-18 ENCOUNTER — Ambulatory Visit: Admit: 2023-12-18 | Discharge: 2023-12-19

## 2023-12-24 ENCOUNTER — Ambulatory Visit: Admit: 2023-12-24 | Attending: Family Medicine | Primary: Family Medicine

## 2023-12-26 DIAGNOSIS — Z21 Asymptomatic human immunodeficiency virus [HIV] infection status: Principal | ICD-10-CM

## 2023-12-26 MED ORDER — BICTEGRAVIR 50 MG-EMTRICITABINE 200 MG-TENOFOVIR ALAFENAM 25 MG TABLET
ORAL_TABLET | Freq: Every day | ORAL | 5 refills | 30.00 days | Status: CP
Start: 2023-12-26 — End: ?

## 2024-01-03 ENCOUNTER — Ambulatory Visit: Admit: 2024-01-03 | Discharge: 2024-01-04 | Payer: BLUE CROSS/BLUE SHIELD

## 2024-01-03 DIAGNOSIS — Z21 Asymptomatic human immunodeficiency virus [HIV] infection status: Principal | ICD-10-CM

## 2024-01-03 MED ORDER — MELATONIN 3 MG TABLET
ORAL_TABLET | Freq: Every evening | ORAL | 2 refills | 30.00 days | Status: CP | PRN
Start: 2024-01-03 — End: 2024-01-03

## 2024-01-03 MED ORDER — ONDANSETRON 4 MG DISINTEGRATING TABLET
ORAL_TABLET | Freq: Three times a day (TID) | 0 refills | 10.00 days | Status: CP | PRN
Start: 2024-01-03 — End: 2024-01-10

## 2024-01-03 MED ORDER — BICTEGRAVIR 50 MG-EMTRICITABINE 200 MG-TENOFOVIR ALAFENAM 25 MG TABLET
ORAL_TABLET | Freq: Every day | ORAL | 5 refills | 30.00 days | Status: CP
Start: 2024-01-03 — End: ?

## 2024-01-03 MED ORDER — GABAPENTIN 400 MG CAPSULE
ORAL_CAPSULE | Freq: Two times a day (BID) | ORAL | 5 refills | 30.00 days | Status: CP
Start: 2024-01-03 — End: 2024-02-03

## 2024-01-24 ENCOUNTER — Ambulatory Visit: Admit: 2024-01-24 | Discharge: 2024-01-25 | Payer: Medicaid (Managed Care)

## 2024-01-24 MED ORDER — GABAPENTIN 400 MG CAPSULE
ORAL_CAPSULE | Freq: Three times a day (TID) | ORAL | 5 refills | 30.00000 days | Status: CP
Start: 2024-01-24 — End: 2024-02-23

## 2024-01-24 MED ORDER — CROMOLYN 4 % EYE DROPS
Freq: Four times a day (QID) | OPHTHALMIC | 2 refills | 0.00000 days | Status: CP
Start: 2024-01-24 — End: 2025-01-23

## 2024-01-24 MED ORDER — BUSPIRONE 5 MG TABLET
ORAL_TABLET | Freq: Two times a day (BID) | ORAL | 5 refills | 30.00000 days | Status: CP
Start: 2024-01-24 — End: 2025-01-23

## 2024-02-07 ENCOUNTER — Ambulatory Visit: Admit: 2024-02-07 | Discharge: 2024-02-08 | Payer: Medicaid (Managed Care)

## 2024-02-07 DIAGNOSIS — R6882 Decreased libido: Principal | ICD-10-CM

## 2024-02-07 DIAGNOSIS — Z21 Asymptomatic human immunodeficiency virus [HIV] infection status: Principal | ICD-10-CM

## 2024-02-17 DIAGNOSIS — B20 Human immunodeficiency virus [HIV] disease: Principal | ICD-10-CM

## 2024-02-17 MED ORDER — SULFAMETHOXAZOLE 800 MG-TRIMETHOPRIM 160 MG TABLET
ORAL_TABLET | Freq: Every day | ORAL | 5 refills | 30.00000 days | Status: CP
Start: 2024-02-17 — End: ?

## 2024-03-07 DIAGNOSIS — Z21 Asymptomatic human immunodeficiency virus [HIV] infection status: Principal | ICD-10-CM

## 2024-03-07 DIAGNOSIS — M792 Neuralgia and neuritis, unspecified: Principal | ICD-10-CM

## 2024-03-07 MED ORDER — DULOXETINE 30 MG CAPSULE,DELAYED RELEASE
ORAL_CAPSULE | Freq: Two times a day (BID) | ORAL | 1 refills | 90.00000 days | Status: CP
Start: 2024-03-07 — End: 2025-03-07

## 2024-03-19 ENCOUNTER — Ambulatory Visit: Admit: 2024-03-19 | Discharge: 2024-03-20 | Payer: Medicaid (Managed Care)

## 2024-03-19 DIAGNOSIS — M792 Neuralgia and neuritis, unspecified: Principal | ICD-10-CM

## 2024-03-19 DIAGNOSIS — Z21 Asymptomatic human immunodeficiency virus [HIV] infection status: Principal | ICD-10-CM

## 2024-05-08 ENCOUNTER — Inpatient Hospital Stay: Admit: 2024-05-08 | Discharge: 2024-05-08 | Payer: Medicaid (Managed Care)

## 2024-05-08 ENCOUNTER — Encounter: Admit: 2024-05-08 | Discharge: 2024-05-08 | Payer: Medicaid (Managed Care)

## 2024-05-08 DIAGNOSIS — R058 Productive cough: Principal | ICD-10-CM

## 2024-05-08 DIAGNOSIS — A310 Pulmonary mycobacterial infection: Principal | ICD-10-CM

## 2024-05-08 DIAGNOSIS — Z21 Asymptomatic human immunodeficiency virus [HIV] infection status: Principal | ICD-10-CM

## 2024-05-08 DIAGNOSIS — R61 Generalized hyperhidrosis: Principal | ICD-10-CM

## 2024-05-08 DIAGNOSIS — R5383 Other fatigue: Principal | ICD-10-CM

## 2024-05-08 MED ORDER — DOXYCYCLINE HYCLATE 100 MG TABLET
ORAL_TABLET | Freq: Two times a day (BID) | ORAL | 0 refills | 7.00000 days | Status: CP
Start: 2024-05-08 — End: 2024-05-15

## 2024-05-08 MED ORDER — GABAPENTIN 400 MG CAPSULE
ORAL_CAPSULE | Freq: Three times a day (TID) | ORAL | 5 refills | 30.00000 days | Status: CP
Start: 2024-05-08 — End: 2024-06-07

## 2024-05-08 MED ORDER — AMOXICILLIN 875 MG-POTASSIUM CLAVULANATE 125 MG TABLET
ORAL_TABLET | Freq: Two times a day (BID) | ORAL | 0 refills | 7.00000 days | Status: CP
Start: 2024-05-08 — End: 2024-05-15

## 2024-05-09 DIAGNOSIS — R779 Abnormality of plasma protein, unspecified: Principal | ICD-10-CM

## 2024-05-09 DIAGNOSIS — D75839 Thrombocytosis: Principal | ICD-10-CM

## 2024-05-09 MED ORDER — PAXLOVID 300 MG (150 MG X 2)-100 MG TABLETS IN A DOSE PACK
ORAL_TABLET | ORAL | 0 refills | 0.00000 days | Status: CP
Start: 2024-05-09 — End: ?

## 2024-05-22 ENCOUNTER — Encounter: Admit: 2024-05-22 | Discharge: 2024-05-22 | Payer: Medicaid (Managed Care)

## 2024-05-22 DIAGNOSIS — A310 Pulmonary mycobacterial infection: Principal | ICD-10-CM

## 2024-05-22 DIAGNOSIS — G63 Polyneuropathy in diseases classified elsewhere: Principal | ICD-10-CM

## 2024-05-22 DIAGNOSIS — B20 Human immunodeficiency virus [HIV] disease: Principal | ICD-10-CM

## 2024-05-22 DIAGNOSIS — Z8616 History of COVID-19: Principal | ICD-10-CM

## 2024-05-22 DIAGNOSIS — Z21 Asymptomatic human immunodeficiency virus [HIV] infection status: Principal | ICD-10-CM

## 2024-05-22 DIAGNOSIS — R252 Cramp and spasm: Principal | ICD-10-CM

## 2024-05-22 DIAGNOSIS — D75839 Thrombocytosis: Principal | ICD-10-CM

## 2024-05-22 MED ORDER — DULOXETINE 30 MG CAPSULE,DELAYED RELEASE
ORAL_CAPSULE | Freq: Two times a day (BID) | ORAL | 1 refills | 90.00000 days | Status: CP
Start: 2024-05-22 — End: 2025-05-22

## 2024-07-03 ENCOUNTER — Encounter: Admit: 2024-07-03 | Discharge: 2024-07-03 | Payer: Medicaid (Managed Care)

## 2024-07-03 DIAGNOSIS — Z21 Asymptomatic human immunodeficiency virus [HIV] infection status: Principal | ICD-10-CM

## 2024-07-03 DIAGNOSIS — A310 Pulmonary mycobacterial infection: Principal | ICD-10-CM

## 2024-07-03 DIAGNOSIS — D75839 Thrombocytosis: Principal | ICD-10-CM

## 2024-07-03 DIAGNOSIS — B20 Human immunodeficiency virus [HIV] disease: Principal | ICD-10-CM

## 2024-07-03 MED ORDER — SULFAMETHOXAZOLE 800 MG-TRIMETHOPRIM 160 MG TABLET
ORAL_TABLET | Freq: Every day | ORAL | 5 refills | 30.00000 days | Status: CP
Start: 2024-07-03 — End: ?

## 2024-07-18 ENCOUNTER — Encounter: Admit: 2024-07-18 | Discharge: 2024-07-18 | Payer: Medicaid (Managed Care)

## 2024-07-18 DIAGNOSIS — R6883 Chills (without fever): Principal | ICD-10-CM

## 2024-07-18 DIAGNOSIS — R202 Paresthesia of skin: Principal | ICD-10-CM

## 2024-07-18 DIAGNOSIS — R2681 Unsteadiness on feet: Principal | ICD-10-CM

## 2024-07-24 ENCOUNTER — Inpatient Hospital Stay: Admit: 2024-07-24 | Discharge: 2024-07-24 | Payer: Medicaid (Managed Care)

## 2024-08-21 ENCOUNTER — Encounter: Admit: 2024-08-21 | Discharge: 2024-08-21 | Payer: Medicaid (Managed Care)

## 2024-08-21 DIAGNOSIS — Z8619 Personal history of other infectious and parasitic diseases: Principal | ICD-10-CM

## 2024-08-21 DIAGNOSIS — A310 Pulmonary mycobacterial infection: Principal | ICD-10-CM

## 2024-08-21 DIAGNOSIS — Z21 Asymptomatic human immunodeficiency virus [HIV] infection status: Principal | ICD-10-CM

## 2024-08-21 DIAGNOSIS — G4700 Insomnia, unspecified: Principal | ICD-10-CM

## 2024-08-21 MED ORDER — MIRTAZAPINE 15 MG TABLET
ORAL_TABLET | Freq: Every evening | ORAL | 5 refills | 30.00000 days | Status: CP
Start: 2024-08-21 — End: 2024-09-20

## 2024-08-21 MED ORDER — ETHAMBUTOL 400 MG TABLET
ORAL_TABLET | Freq: Every day | ORAL | 2 refills | 30.00000 days | Status: CP
Start: 2024-08-21 — End: 2025-08-21

## 2024-08-27 MED ORDER — AZITHROMYCIN 600 MG TABLET
ORAL_TABLET | Freq: Every day | ORAL | 2 refills | 30.00000 days | Status: CP
Start: 2024-08-27 — End: ?

## 2024-09-01 MED ORDER — DOXYCYCLINE HYCLATE 100 MG TABLET
ORAL_TABLET | Freq: Two times a day (BID) | ORAL | 0 refills | 14.00000 days | Status: CP
Start: 2024-09-01 — End: 2024-09-15

## 2024-09-29 DIAGNOSIS — Z21 Asymptomatic human immunodeficiency virus [HIV] infection status: Principal | ICD-10-CM

## 2024-09-29 MED ORDER — BICTEGRAVIR 50 MG-EMTRICITABINE 200 MG-TENOFOVIR ALAFENAM 25 MG TABLET
ORAL_TABLET | Freq: Every day | ORAL | 11 refills | 30.00000 days | Status: CP
Start: 2024-09-29 — End: ?

## 2024-10-01 DIAGNOSIS — Z21 Asymptomatic human immunodeficiency virus [HIV] infection status: Principal | ICD-10-CM

## 2024-10-01 MED ORDER — BIKTARVY 50 MG-200 MG-25 MG TABLET
ORAL_TABLET | Freq: Every morning | ORAL | 11 refills | 30.00000 days | Status: CP
Start: 2024-10-01 — End: ?

## 2024-10-23 DIAGNOSIS — R059 Cough, unspecified type: Secondary | ICD-10-CM

## 2024-10-23 DIAGNOSIS — Z21 Asymptomatic human immunodeficiency virus [HIV] infection status: Principal | ICD-10-CM

## 2024-10-23 MED ORDER — ALBUTEROL SULFATE HFA 90 MCG/ACTUATION AEROSOL INHALER
Freq: Four times a day (QID) | RESPIRATORY_TRACT | 0 refills | 0.00000 days | Status: CP | PRN
Start: 2024-10-23 — End: 2025-10-23
